# Patient Record
Sex: Female | Born: 1995 | Race: White | Hispanic: No | Marital: Single | Smoking: Current every day smoker
Health system: Southern US, Community
[De-identification: ages and names within clinical notes are randomized; demographics above are authoritative.]

## PROBLEM LIST (undated history)

## (undated) DIAGNOSIS — F3181 Bipolar II disorder: Secondary | ICD-10-CM

## (undated) DIAGNOSIS — F32A Depression, unspecified: Secondary | ICD-10-CM

## (undated) DIAGNOSIS — F603 Borderline personality disorder: Secondary | ICD-10-CM

## (undated) DIAGNOSIS — C801 Malignant (primary) neoplasm, unspecified: Secondary | ICD-10-CM

## (undated) DIAGNOSIS — Q615 Medullary cystic kidney: Secondary | ICD-10-CM

## (undated) DIAGNOSIS — F99 Mental disorder, not otherwise specified: Secondary | ICD-10-CM

## (undated) DIAGNOSIS — F419 Anxiety disorder, unspecified: Secondary | ICD-10-CM

## (undated) DIAGNOSIS — B009 Herpesviral infection, unspecified: Secondary | ICD-10-CM

## (undated) DIAGNOSIS — R569 Unspecified convulsions: Secondary | ICD-10-CM

## (undated) DIAGNOSIS — Z8489 Family history of other specified conditions: Secondary | ICD-10-CM

## (undated) DIAGNOSIS — G43909 Migraine, unspecified, not intractable, without status migrainosus: Secondary | ICD-10-CM

## (undated) DIAGNOSIS — G939 Disorder of brain, unspecified: Secondary | ICD-10-CM

## (undated) DIAGNOSIS — F633 Trichotillomania: Secondary | ICD-10-CM

## (undated) DIAGNOSIS — O2300 Infections of kidney in pregnancy, unspecified trimester: Secondary | ICD-10-CM

## (undated) DIAGNOSIS — C966 Unifocal Langerhans-cell histiocytosis: Secondary | ICD-10-CM

## (undated) HISTORY — DX: Unifocal Langerhans-cell histiocytosis: C96.6

## (undated) HISTORY — DX: Infections of kidney in pregnancy, unspecified trimester: O23.00

## (undated) HISTORY — DX: Herpesviral infection, unspecified: B00.9

## (undated) HISTORY — DX: Disorder of brain, unspecified: G93.9

---

## 2017-12-30 HISTORY — PX: WISDOM TOOTH EXTRACTION: SHX21

## 2019-02-04 DIAGNOSIS — S060XAA Concussion with loss of consciousness status unknown, initial encounter: Secondary | ICD-10-CM

## 2019-02-04 HISTORY — DX: Concussion with loss of consciousness status unknown, initial encounter: S06.0XAA

## 2020-04-16 NOTE — L&D Delivery Note (Signed)
Patient was C/C/+4 and pushed for 5 minutes with epidural.    NSVD  female infant, Apgars 8,9, weight P.   The patient had no lacerations. Fundus was firm. EBL was expected amount. Placenta was delivered intact. Vagina was clear.  Delayed cord clamping done for 30-60 seconds while warming baby. Baby was vigorous and doing skin to skin with mother.  Daria Pastures

## 2020-08-26 LAB — OB RESULTS CONSOLE GC/CHLAMYDIA
Chlamydia: NEGATIVE
Gonorrhea: NEGATIVE

## 2020-08-26 LAB — OB RESULTS CONSOLE RPR: RPR: NONREACTIVE

## 2020-08-26 LAB — OB RESULTS CONSOLE HEPATITIS B SURFACE ANTIGEN: Hepatitis B Surface Ag: NEGATIVE

## 2020-08-26 LAB — OB RESULTS CONSOLE ABO/RH: RH Type: POSITIVE

## 2020-08-26 LAB — OB RESULTS CONSOLE RUBELLA ANTIBODY, IGM: Rubella: IMMUNE

## 2020-08-26 LAB — OB RESULTS CONSOLE ANTIBODY SCREEN: Antibody Screen: NEGATIVE

## 2020-08-26 LAB — OB RESULTS CONSOLE HIV ANTIBODY (ROUTINE TESTING): HIV: NONREACTIVE

## 2020-09-28 ENCOUNTER — Other Ambulatory Visit: Payer: Self-pay | Admitting: Obstetrics and Gynecology

## 2020-09-28 ENCOUNTER — Encounter (HOSPITAL_COMMUNITY): Payer: Self-pay | Admitting: Obstetrics and Gynecology

## 2020-09-28 ENCOUNTER — Other Ambulatory Visit (HOSPITAL_COMMUNITY): Payer: Self-pay | Admitting: Obstetrics and Gynecology

## 2020-09-28 ENCOUNTER — Other Ambulatory Visit: Payer: Self-pay

## 2020-09-28 ENCOUNTER — Observation Stay (HOSPITAL_COMMUNITY): Payer: Medicaid Other

## 2020-09-28 ENCOUNTER — Observation Stay (HOSPITAL_COMMUNITY)
Admission: AD | Admit: 2020-09-28 | Discharge: 2020-09-29 | Disposition: A | Discharge status: Home / Self Care | Payer: Medicaid Other | Attending: Obstetrics | Admitting: Obstetrics

## 2020-09-28 DIAGNOSIS — B9689 Other specified bacterial agents as the cause of diseases classified elsewhere: Secondary | ICD-10-CM | POA: Diagnosis not present

## 2020-09-28 DIAGNOSIS — O26892 Other specified pregnancy related conditions, second trimester: Secondary | ICD-10-CM | POA: Diagnosis present

## 2020-09-28 DIAGNOSIS — O99332 Smoking (tobacco) complicating pregnancy, second trimester: Secondary | ICD-10-CM | POA: Insufficient documentation

## 2020-09-28 DIAGNOSIS — O26899 Other specified pregnancy related conditions, unspecified trimester: Secondary | ICD-10-CM

## 2020-09-28 DIAGNOSIS — O2302 Infections of kidney in pregnancy, second trimester: Secondary | ICD-10-CM | POA: Diagnosis not present

## 2020-09-28 DIAGNOSIS — Z3A16 16 weeks gestation of pregnancy: Secondary | ICD-10-CM | POA: Insufficient documentation

## 2020-09-28 DIAGNOSIS — Z20822 Contact with and (suspected) exposure to covid-19: Secondary | ICD-10-CM | POA: Diagnosis not present

## 2020-09-28 DIAGNOSIS — N12 Tubulo-interstitial nephritis, not specified as acute or chronic: Secondary | ICD-10-CM

## 2020-09-28 DIAGNOSIS — O2300 Infections of kidney in pregnancy, unspecified trimester: Secondary | ICD-10-CM | POA: Diagnosis present

## 2020-09-28 DIAGNOSIS — F17210 Nicotine dependence, cigarettes, uncomplicated: Secondary | ICD-10-CM | POA: Diagnosis not present

## 2020-09-28 HISTORY — DX: Mental disorder, not otherwise specified: F99

## 2020-09-28 LAB — RESP PANEL BY RT-PCR (FLU A&B, COVID) ARPGX2
Influenza A by PCR: NEGATIVE
Influenza B by PCR: NEGATIVE
SARS Coronavirus 2 by RT PCR: NEGATIVE

## 2020-09-28 MED ORDER — CALCIUM CARBONATE ANTACID 500 MG PO CHEW: 2.0000 | CHEWABLE_TABLET | ORAL | Status: DC | PRN

## 2020-09-28 MED ORDER — SODIUM CHLORIDE 0.9 % IV SOLN
1.0000 g | INTRAVENOUS | Status: DC
  Administered 2020-09-29: 1 g via INTRAVENOUS
  Filled 2020-09-28: qty 10

## 2020-09-28 MED ORDER — ACETAMINOPHEN 325 MG PO TABS: 650.0000 mg | ORAL_TABLET | ORAL | Status: DC | PRN

## 2020-09-28 MED ORDER — FAMOTIDINE 20 MG PO TABS
20.0000 mg | ORAL_TABLET | Freq: Two times a day (BID) | ORAL | Status: DC
  Filled 2020-09-28 (×2): qty 1

## 2020-09-28 MED ORDER — DOCUSATE SODIUM 100 MG PO CAPS
100.0000 mg | ORAL_CAPSULE | Freq: Every day | ORAL | Status: DC
  Filled 2020-09-28: qty 1

## 2020-09-28 MED ORDER — COMPLETENATE 29-1 MG PO CHEW: 1.0000 | CHEWABLE_TABLET | Freq: Every day | ORAL | Status: DC

## 2020-09-28 MED ORDER — LACTATED RINGERS IV SOLN: INTRAVENOUS | Status: DC

## 2020-09-28 MED ORDER — PRENATAL MULTIVITAMIN CH: 1.0000 | ORAL_TABLET | Freq: Every day | ORAL | Status: DC
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
## 2020-09-28 NOTE — Plan of Care (Signed)
  Problem: Education: Goal: Knowledge of General Education information will improve Description: Including pain rating scale, medication(s)/side effects and non-pharmacologic comfort measures Outcome: Completed/Met

## 2020-09-29 ENCOUNTER — Encounter (HOSPITAL_COMMUNITY): Payer: Self-pay

## 2020-09-29 ENCOUNTER — Ambulatory Visit (HOSPITAL_COMMUNITY): Payer: Self-pay

## 2020-09-29 DIAGNOSIS — O2302 Infections of kidney in pregnancy, second trimester: Secondary | ICD-10-CM | POA: Diagnosis not present

## 2020-09-29 MED ORDER — SULFAMETHOXAZOLE-TRIMETHOPRIM 800-160 MG PO TABS: 1.0000 | ORAL_TABLET | Freq: Two times a day (BID) | ORAL | 0 refills | Status: AC

## 2020-09-29 MED ORDER — SODIUM CHLORIDE 0.9 % IV SOLN
INTRAVENOUS | Status: DC | PRN
  Administered 2020-09-29: 250 mL via INTRAVENOUS

## 2020-09-29 MED ORDER — CEPHALEXIN 500 MG PO CAPS: 500.0000 mg | ORAL_CAPSULE | Freq: Every day | ORAL | 1 refills | Status: DC

## 2020-09-29 NOTE — Progress Notes (Addendum)
Carol Middleton 25 y.o. [id number] at [redacted]w[redacted]d HD#2 admitted with suspected pyelonephritis S: Reports feeling much better, has baseline nausea in pregnancy, no emesis. Reports dysuria and back pain improved. Some back discomfort from sleeping in hospital bed. No other concerns.   O: Vitals:   09/28/20 2000 09/28/20 2322 09/29/20 0500 09/29/20 0808  BP: 106/66 (!) 94/44 (!) 105/56 (!) 106/52  Pulse: 93 88 90 81  Resp: 18 18 18 18   Temp: 98 F (36.7 C) 99.3 F (37.4 C) 99.6 F (37.6 C) 98.9 F (37.2 C)  TempSrc: Oral Oral Oral Oral  SpO2: 100% 100% 97% 99%  Weight:      Height:       Urine culture pending  FHTs reassuring  09/28/2020 Renal US IMPRESSION: 1. No hydronephrosis or shadowing stone. 2. Artifact versus possible debris within the bladder. Correlation with urinalysis recommended.  A/P: [facility] 25 y.o. [id number] at [redacted]w[redacted]d with suspected pyelonephritis, now improving.  Pyelonephritis: s/p IV rocephin yesterday, ordered for today. Symptoms improved and remains afebrile will discharge on PO regimen while waiting urine culture. Previously prescribed cefadroxil 1g BID x 14days, but pharmacy currently does not have. Will send bactrim DS BIDx12 days (which she can crush) and then keflex 500mg  capsules QD for suppression (ok to open capsule).  Carol Middleton 09/29/20 8:28 AM

## 2020-09-29 NOTE — Plan of Care (Signed)
  Problem: Health Behavior/Discharge Planning: Goal: Ability to manage health-related needs will improve Outcome: Adequate for Discharge   Problem: Clinical Measurements: Goal: Ability to maintain clinical measurements within normal limits will improve Outcome: Adequate for Discharge Goal: Will remain free from infection Outcome: Adequate for Discharge Goal: Diagnostic test results will improve Outcome: Adequate for Discharge Goal: Respiratory complications will improve Outcome: Adequate for Discharge Goal: Cardiovascular complication will be avoided Outcome: Adequate for Discharge   Problem: Activity: Goal: Risk for activity intolerance will decrease Outcome: Adequate for Discharge   Problem: Nutrition: Goal: Adequate nutrition will be maintained Outcome: Adequate for Discharge   Problem: Coping: Goal: Level of anxiety will decrease Outcome: Adequate for Discharge   Problem: Elimination: Goal: Will not experience complications related to bowel motility Outcome: Adequate for Discharge Goal: Will not experience complications related to urinary retention Outcome: Adequate for Discharge   Problem: Pain Managment: Goal: General experience of comfort will improve Outcome: Adequate for Discharge   Problem: Safety: Goal: Ability to remain free from injury will improve Outcome: Adequate for Discharge   Problem: Skin Integrity: Goal: Risk for impaired skin integrity will decrease Outcome: Adequate for Discharge   Problem: Education: Goal: Knowledge of disease or condition will improve Outcome: Adequate for Discharge Goal: Knowledge of the prescribed therapeutic regimen will improve Outcome: Adequate for Discharge Goal: Individualized Educational Video(s) Outcome: Adequate for Discharge   Problem: Clinical Measurements: Goal: Complications related to the disease process, condition or treatment will be avoided or minimized Outcome: Adequate for Discharge

## 2020-09-29 NOTE — Discharge Summary (Signed)
     Antepartum Discharge Summary     Patient Name: Carol Middleton DOB: 05/24/1995 MRN: [id number]  Date of admission: 09/28/2020 Date of discharge: 09/29/2020  Admitting diagnosis: Pyelonephritis affecting pregnancy, antepartum [O23.00] Intrauterine pregnancy: [redacted]w[redacted]d     Secondary diagnosis:  Active Problems:   Pyelonephritis affecting pregnancy, antepartum  Additional problems: none    Discharge diagnosis: Pyelonephritis affecting pregnancy, antepartum [O23.00]                                              Hospital course: [facility] Opfer was admitted 09/28/2020 from the office for admission for suspected pyelonephritis.  She presented today with complaint of left flank pain x 1 week.  Reports painful urination, hematuria, dysuria and urinary frequency.  UA was significant for +nitrites, leukocytes and moderate heme.  Tmax in the office was 99.8.  Patient initially declined hospital admission due to concerns about childcare. In the office she received 1 dose of IM rocephin.  Stat CBC was significant for wbc 13.6.  CMP with normal creatinine. Patient then agreed with inpatient admission. She was monitored overnight, reported symptoms improved and was given a second dose of rocephin. Renal US was normal. Patient does not swallow pills, she was prescribed: bactrim DS BIDx12 days (which she can crush) and then keflex 500mg  capsules QD for suppression (ok to open capsule). On HD#2 she was ambulating, tolerating PO, voiding spontaneously. She was discharged in stable condition on 09/29/20   Physical exam  Vitals:   09/28/20 2322 09/29/20 0500 09/29/20 0808 09/29/20 1130  BP: (!) 94/44 (!) 105/56 (!) 106/52 (!) 108/52  Pulse: 88 90 81 70  Resp: 18 18 18 18   Temp: 99.3 F (37.4 C) 99.6 F (37.6 C) 98.9 F (37.2 C) 98.8 F (37.1 C)  TempSrc: Oral Oral Oral   SpO2: 100% 97% 99% 98%  Weight:      Height:       General: alert No CVA tenderness Resting comfortably in bed FHTs  reassuring   After visit meds:  Allergies as of 09/29/2020       Reactions   Codeine Hives        Medication List     TAKE these medications    cephALEXin 500 MG capsule Commonly known as: Keflex Take 1 capsule (500 mg total) by mouth daily.   sulfamethoxazole-trimethoprim 800-160 MG tablet Commonly known as: BACTRIM DS Take 1 tablet by mouth 2 (two) times daily for 12 days.         Discharge home in stable condition Follow up Visit:  Follow-up Information     Jerelyn Charles, MD Follow up.   Specialty: Obstetrics Why: Keep scheduled appointment 10/20/2020 Contact information: [address] [place] [address] [phone]                     09/29/2020 Jonelle Sidle, MD

## 2020-09-30 DIAGNOSIS — R55 Syncope and collapse: Secondary | ICD-10-CM

## 2020-09-30 HISTORY — DX: Syncope and collapse: R55

## 2020-09-30 LAB — URINE CULTURE: Culture: NO GROWTH

## 2020-10-26 ENCOUNTER — Other Ambulatory Visit: Payer: Self-pay | Admitting: Obstetrics

## 2020-10-26 DIAGNOSIS — Z3A20 20 weeks gestation of pregnancy: Secondary | ICD-10-CM

## 2020-10-26 DIAGNOSIS — Z363 Encounter for antenatal screening for malformations: Secondary | ICD-10-CM

## 2020-11-01 ENCOUNTER — Other Ambulatory Visit: Payer: Self-pay

## 2020-11-10 ENCOUNTER — Ambulatory Visit (HOSPITAL_BASED_OUTPATIENT_CLINIC_OR_DEPARTMENT_OTHER): Payer: Medicaid Other | Admitting: Obstetrics

## 2020-11-10 ENCOUNTER — Other Ambulatory Visit: Payer: Self-pay | Admitting: *Deleted

## 2020-11-10 ENCOUNTER — Ambulatory Visit: Payer: Medicaid Other | Attending: Obstetrics and Gynecology

## 2020-11-10 ENCOUNTER — Ambulatory Visit: Payer: Medicaid Other | Admitting: *Deleted

## 2020-11-10 ENCOUNTER — Other Ambulatory Visit: Payer: Self-pay

## 2020-11-10 ENCOUNTER — Encounter: Payer: Self-pay | Admitting: *Deleted

## 2020-11-10 VITALS — BP 101/56 | HR 100

## 2020-11-10 DIAGNOSIS — Z3A20 20 weeks gestation of pregnancy: Secondary | ICD-10-CM | POA: Diagnosis not present

## 2020-11-10 DIAGNOSIS — Z3A22 22 weeks gestation of pregnancy: Secondary | ICD-10-CM | POA: Insufficient documentation

## 2020-11-10 DIAGNOSIS — O283 Abnormal ultrasonic finding on antenatal screening of mother: Secondary | ICD-10-CM | POA: Insufficient documentation

## 2020-11-10 DIAGNOSIS — Z363 Encounter for antenatal screening for malformations: Secondary | ICD-10-CM | POA: Insufficient documentation

## 2020-11-10 DIAGNOSIS — O365921 Maternal care for other known or suspected poor fetal growth, second trimester, fetus 1: Secondary | ICD-10-CM | POA: Diagnosis not present

## 2020-11-10 DIAGNOSIS — O365990 Maternal care for other known or suspected poor fetal growth, unspecified trimester, not applicable or unspecified: Secondary | ICD-10-CM

## 2020-11-10 DIAGNOSIS — O359XX0 Maternal care for (suspected) fetal abnormality and damage, unspecified, not applicable or unspecified: Secondary | ICD-10-CM

## 2020-11-10 NOTE — Progress Notes (Addendum)
MFM Note  Carol Middleton was seen for a detailed fetal anatomy scan as echogenic material was noted in the fetal stomach during an exam performed in your office. The patient reports that she has a cystic lesion (which has been stable) in her brain and is currently followed by neurology at [facility].    This is her fourth child.  Her first two children were delivered at term weighing between 7 to 8 pounds.  Her last child was growth restricted weighing 5 pounds 4 ounces at term.  She denies any other significant past medical history and denies any problems in her current pregnancy.  She denies any vaginal bleeding.  She had a cell free DNA test earlier in her pregnancy which indicated a low risk for trisomy 71, 15, and 13. A female fetus is predicted.   The estimated fetal weight obtained today measures at the 6 percentile, indicating fetal growth restriction.  There was normal amniotic fluid noted today.  Doppler studies of the umbilical arteries performed today showed a normal S/D ratio of 2.79.  There were no signs of absent or reversed end-diastolic flow noted.  There were no obvious fetal anomalies noted on today's ultrasound exam.  The patient was informed that anomalies may be missed due to technical limitations. If the fetus is in a suboptimal position or maternal habitus is increased, visualization of the fetus in the maternal uterus may be impaired.  Echogenic material was not noted within the fetal stomach today.  However, a large amount of debris (possible vernix?) was noted floating/swirling around in the amniotic fluid.  The patient was advised that the previously noted echogenic material in the fetal stomach could have been the debris that was noted in the amniotic fluid today.  The fetal bowel did not appear echogenic.  The implications and management of fetal growth restriction was discussed with the patient today.  She was advised that we will continue to follow her with umbilical  artery Doppler studies and growth ultrasounds throughout her pregnancy.  Should IUGR continue to be noted during her future exams, an earlier delivery may be recommended.  The patient declined to have testing for CMV and toxoplasmosis today as echogenic material was not noted within the fetal abdomen today.    Another umbilical artery Doppler study was scheduled in our office in 2 weeks.  We will reassess the fetal growth again in 3 weeks.    The patient stated that all of her questions have been answered to her complete satisfaction.    A total of 30 minutes was spent counseling and coordinating the care for this patient.  Greater than 50% of the time was spent in direct face-to-face contact.

## 2020-11-23 ENCOUNTER — Ambulatory Visit: Payer: Medicaid Other

## 2020-11-25 ENCOUNTER — Ambulatory Visit: Payer: Medicaid Other | Attending: Obstetrics

## 2020-11-25 ENCOUNTER — Other Ambulatory Visit: Payer: Medicaid Other

## 2020-12-01 ENCOUNTER — Other Ambulatory Visit: Payer: Self-pay

## 2020-12-01 ENCOUNTER — Encounter: Payer: Self-pay | Admitting: *Deleted

## 2020-12-01 ENCOUNTER — Ambulatory Visit: Payer: Medicaid Other | Admitting: *Deleted

## 2020-12-01 ENCOUNTER — Ambulatory Visit: Payer: Medicaid Other | Attending: Obstetrics and Gynecology

## 2020-12-01 VITALS — BP 107/53 | HR 69

## 2020-12-01 DIAGNOSIS — O365990 Maternal care for other known or suspected poor fetal growth, unspecified trimester, not applicable or unspecified: Secondary | ICD-10-CM | POA: Insufficient documentation

## 2020-12-01 DIAGNOSIS — Z3A25 25 weeks gestation of pregnancy: Secondary | ICD-10-CM

## 2020-12-01 DIAGNOSIS — F17210 Nicotine dependence, cigarettes, uncomplicated: Secondary | ICD-10-CM

## 2020-12-01 DIAGNOSIS — O365920 Maternal care for other known or suspected poor fetal growth, second trimester, not applicable or unspecified: Secondary | ICD-10-CM

## 2020-12-01 DIAGNOSIS — O99332 Smoking (tobacco) complicating pregnancy, second trimester: Secondary | ICD-10-CM

## 2020-12-01 DIAGNOSIS — Z362 Encounter for other antenatal screening follow-up: Secondary | ICD-10-CM

## 2020-12-01 DIAGNOSIS — O99342 Other mental disorders complicating pregnancy, second trimester: Secondary | ICD-10-CM

## 2020-12-01 DIAGNOSIS — F319 Bipolar disorder, unspecified: Secondary | ICD-10-CM

## 2020-12-01 DIAGNOSIS — Z86011 Personal history of benign neoplasm of the brain: Secondary | ICD-10-CM

## 2020-12-02 ENCOUNTER — Other Ambulatory Visit: Payer: Self-pay | Admitting: *Deleted

## 2020-12-02 DIAGNOSIS — O365920 Maternal care for other known or suspected poor fetal growth, second trimester, not applicable or unspecified: Secondary | ICD-10-CM

## 2020-12-16 ENCOUNTER — Other Ambulatory Visit: Payer: Self-pay | Admitting: *Deleted

## 2020-12-16 ENCOUNTER — Ambulatory Visit: Payer: Medicaid Other | Admitting: *Deleted

## 2020-12-16 ENCOUNTER — Other Ambulatory Visit: Payer: Self-pay

## 2020-12-16 ENCOUNTER — Ambulatory Visit: Payer: Medicaid Other | Attending: Maternal & Fetal Medicine

## 2020-12-16 VITALS — BP 108/63 | HR 109

## 2020-12-16 DIAGNOSIS — O365920 Maternal care for other known or suspected poor fetal growth, second trimester, not applicable or unspecified: Secondary | ICD-10-CM | POA: Insufficient documentation

## 2020-12-16 DIAGNOSIS — O365930 Maternal care for other known or suspected poor fetal growth, third trimester, not applicable or unspecified: Secondary | ICD-10-CM | POA: Diagnosis not present

## 2020-12-16 DIAGNOSIS — Z3A28 28 weeks gestation of pregnancy: Secondary | ICD-10-CM

## 2020-12-16 DIAGNOSIS — Z362 Encounter for other antenatal screening follow-up: Secondary | ICD-10-CM

## 2020-12-16 DIAGNOSIS — O99333 Smoking (tobacco) complicating pregnancy, third trimester: Secondary | ICD-10-CM | POA: Diagnosis not present

## 2020-12-23 ENCOUNTER — Other Ambulatory Visit: Payer: Self-pay

## 2020-12-23 ENCOUNTER — Ambulatory Visit: Payer: Medicaid Other | Attending: Maternal & Fetal Medicine

## 2020-12-23 ENCOUNTER — Encounter: Payer: Self-pay | Admitting: *Deleted

## 2020-12-23 ENCOUNTER — Ambulatory Visit: Payer: Medicaid Other | Admitting: *Deleted

## 2020-12-23 VITALS — BP 109/60 | HR 66

## 2020-12-23 DIAGNOSIS — O365920 Maternal care for other known or suspected poor fetal growth, second trimester, not applicable or unspecified: Secondary | ICD-10-CM | POA: Insufficient documentation

## 2020-12-23 DIAGNOSIS — O365930 Maternal care for other known or suspected poor fetal growth, third trimester, not applicable or unspecified: Secondary | ICD-10-CM | POA: Insufficient documentation

## 2020-12-28 ENCOUNTER — Other Ambulatory Visit: Payer: Self-pay | Admitting: Obstetrics and Gynecology

## 2020-12-30 ENCOUNTER — Encounter: Payer: Self-pay | Admitting: *Deleted

## 2020-12-30 ENCOUNTER — Other Ambulatory Visit: Payer: Self-pay | Admitting: Obstetrics and Gynecology

## 2020-12-30 ENCOUNTER — Other Ambulatory Visit: Payer: Self-pay

## 2020-12-30 ENCOUNTER — Ambulatory Visit: Payer: Medicaid Other | Admitting: *Deleted

## 2020-12-30 ENCOUNTER — Ambulatory Visit: Payer: Medicaid Other | Attending: Obstetrics and Gynecology

## 2020-12-30 VITALS — BP 115/66 | HR 94

## 2020-12-30 DIAGNOSIS — Z3A30 30 weeks gestation of pregnancy: Secondary | ICD-10-CM

## 2020-12-30 DIAGNOSIS — O365930 Maternal care for other known or suspected poor fetal growth, third trimester, not applicable or unspecified: Secondary | ICD-10-CM | POA: Insufficient documentation

## 2020-12-30 DIAGNOSIS — O99333 Smoking (tobacco) complicating pregnancy, third trimester: Secondary | ICD-10-CM | POA: Diagnosis not present

## 2021-01-06 ENCOUNTER — Encounter: Payer: Self-pay | Admitting: *Deleted

## 2021-01-06 ENCOUNTER — Ambulatory Visit: Payer: Medicaid Other | Admitting: *Deleted

## 2021-01-06 ENCOUNTER — Ambulatory Visit: Payer: Medicaid Other | Attending: Obstetrics and Gynecology

## 2021-01-06 ENCOUNTER — Other Ambulatory Visit: Payer: Self-pay

## 2021-01-06 ENCOUNTER — Ambulatory Visit (HOSPITAL_BASED_OUTPATIENT_CLINIC_OR_DEPARTMENT_OTHER): Payer: Medicaid Other | Admitting: *Deleted

## 2021-01-06 VITALS — BP 120/68 | HR 105

## 2021-01-06 DIAGNOSIS — O365930 Maternal care for other known or suspected poor fetal growth, third trimester, not applicable or unspecified: Secondary | ICD-10-CM

## 2021-01-06 DIAGNOSIS — O99343 Other mental disorders complicating pregnancy, third trimester: Secondary | ICD-10-CM

## 2021-01-06 DIAGNOSIS — Z3A31 31 weeks gestation of pregnancy: Secondary | ICD-10-CM | POA: Insufficient documentation

## 2021-01-06 DIAGNOSIS — O99333 Smoking (tobacco) complicating pregnancy, third trimester: Secondary | ICD-10-CM

## 2021-01-06 DIAGNOSIS — F319 Bipolar disorder, unspecified: Secondary | ICD-10-CM | POA: Diagnosis not present

## 2021-01-06 IMAGING — US US MFM UA CORD DOPPLER
1 series · 14 of 28 positions shown · non-contrast
Comparison: none

[Series 1: us mfm ua cord doppler · 45 acquisitions, 14 frames shown]
[im 2/45]
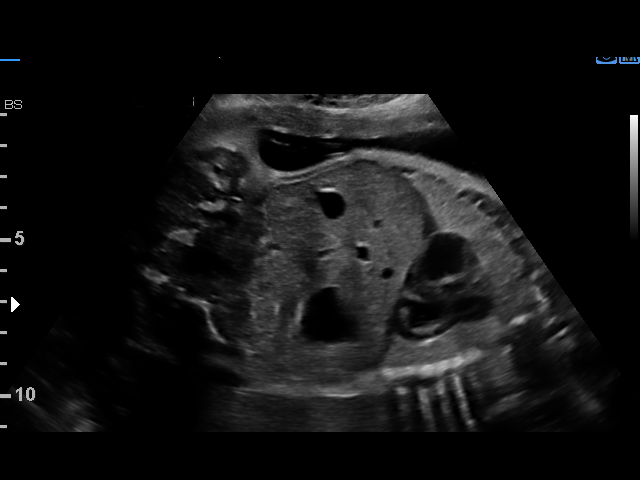
[im 5/45]
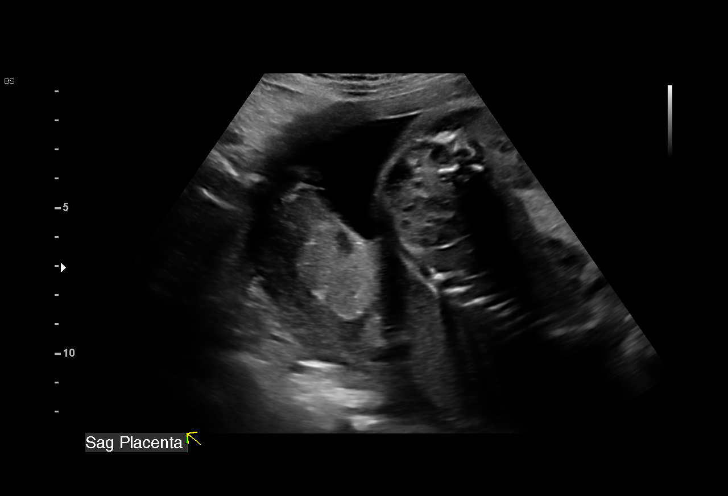
[im 9/45]
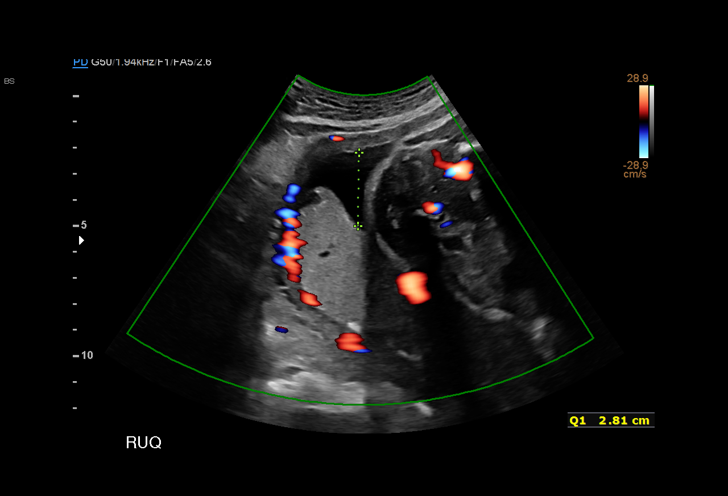
[im 12/45]
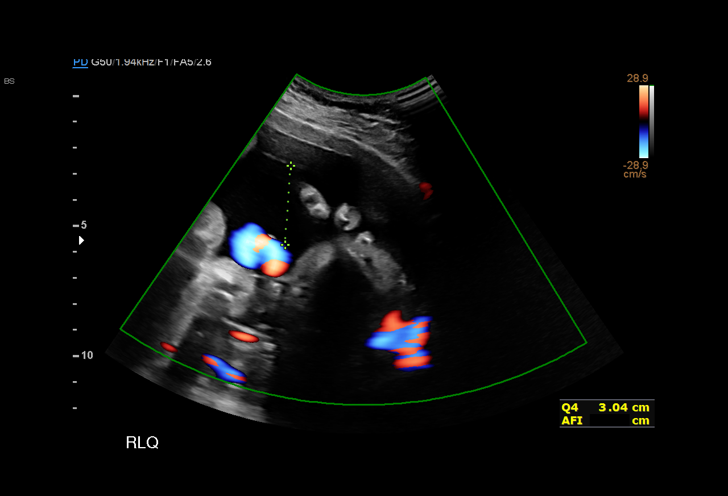
[im 15/45]
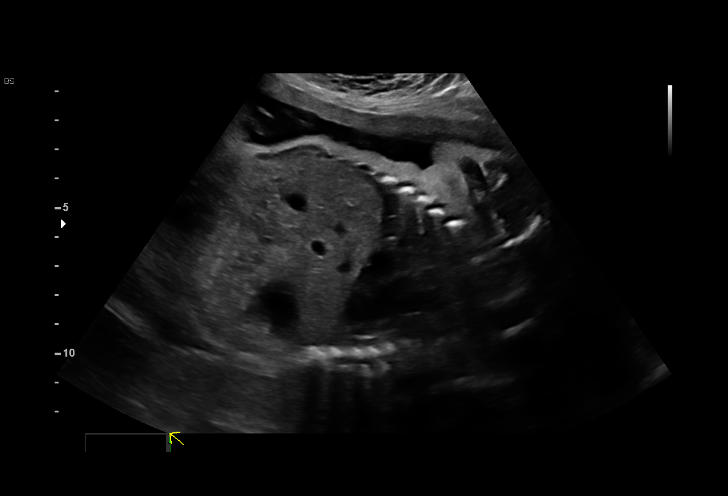
[im 18/45]
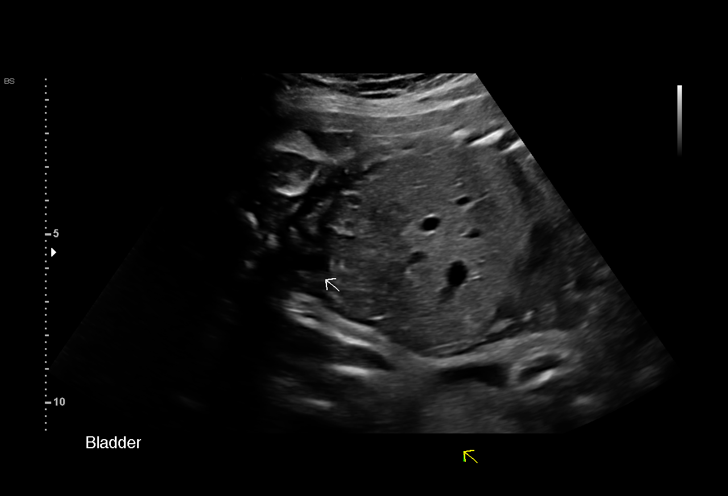
[im 22/45]
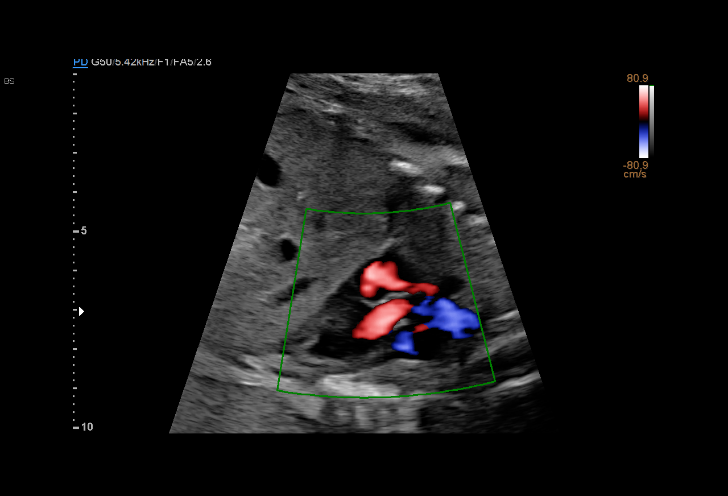
[im 25/45]
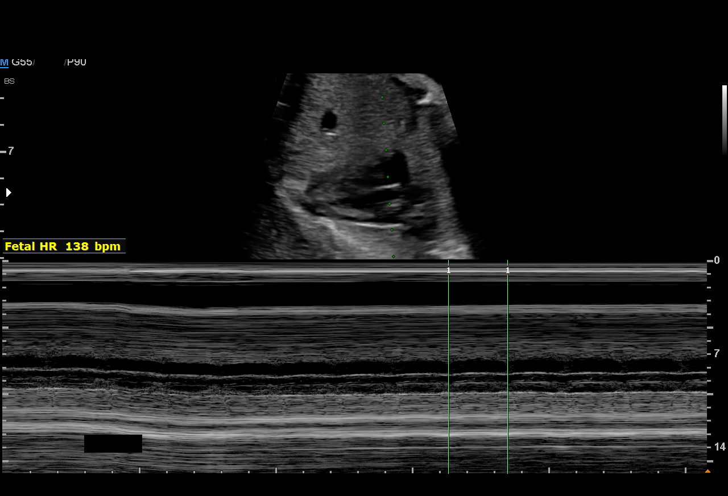
[im 28/45]
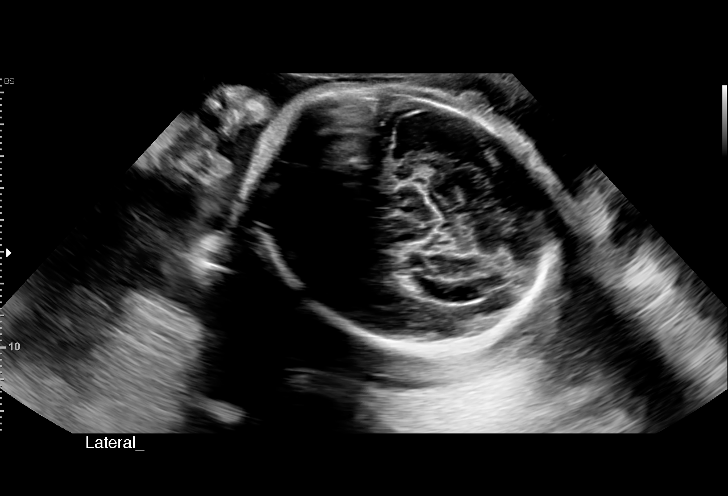
[im 31/45]
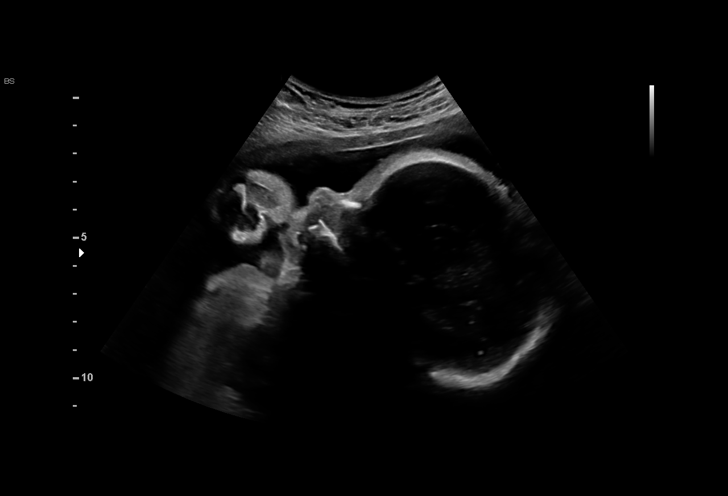
[im 35/45]
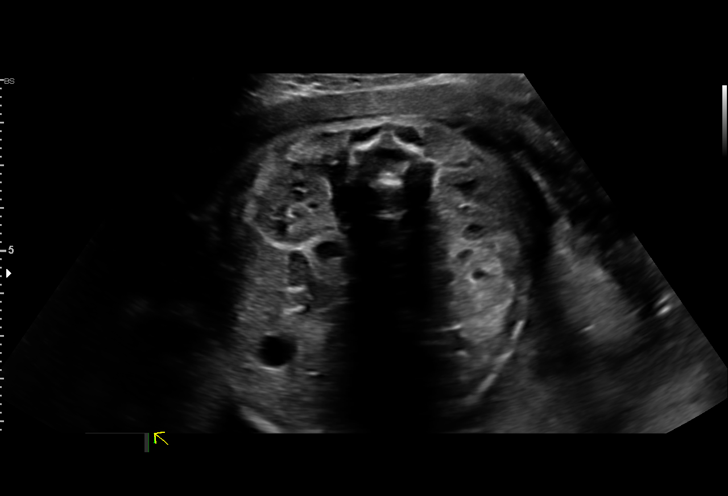
[im 38/45]
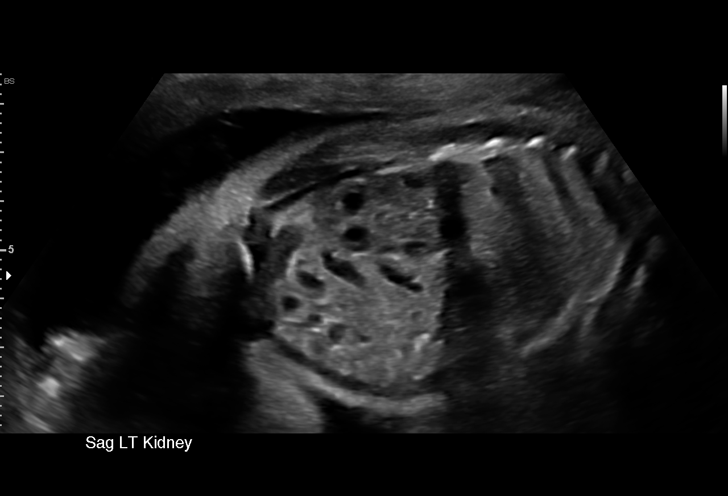
[im 41/45]
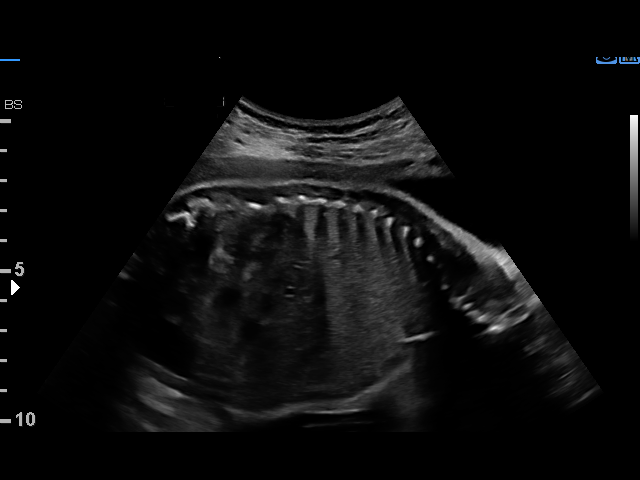
[im 45/45]
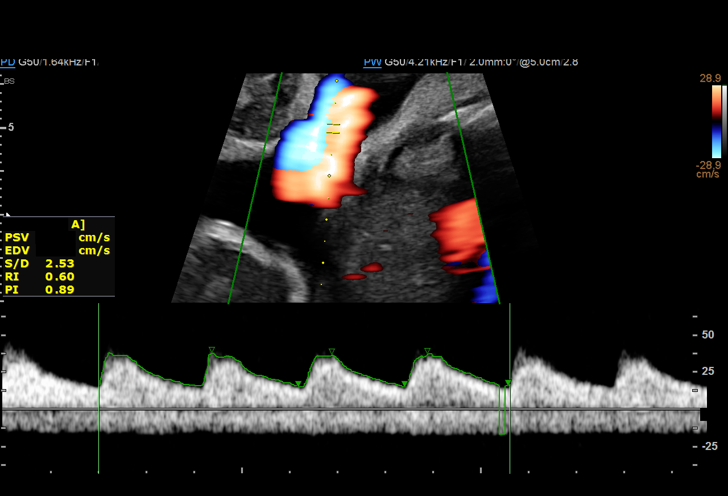

[14 of 28 positions shown; findings below may reference images not displayed]

OBGYN

Indications

 Maternal care for known or suspected poor      [E8]
 fetal growth, third trimester, not applicable or
 unspecified IUGR
 Small for gestational age fetus affecting      [E8]
 management of mother
 Medical complication of pregnancy (hx cystic   [E8]
 lesion on brain)
 Tobacco use complicating pregnancy, third      [E8]
 trimester
 Bipolar disease during pregnancy               [E8] [E8]
 LR NIPS
 Encounter for other antenatal screening        [E8]
 follow-up
 31 weeks gestation of pregnancy
Fetal Evaluation

 Num Of Fetuses:         1
 Fetal Heart Rate(bpm):  138
 Cardiac Activity:       Observed
 Presentation:           Cephalic
 Placenta:               Posterior
 P. Cord Insertion:      Visualized, central
 Amniotic Fluid
 AFI FV:      Within normal limits

 AFI Sum(cm)     %Tile       Largest Pocket(cm)
 11.51           27

 RUQ(cm)       RLQ(cm)       LUQ(cm)        LLQ(cm)

Biometry

 LV:        5.6  mm
OB History

 Gravidity:    4         Term:   3
 Living:       3
Gestational Age

 LMP:           31w 0d        Date:  [DATE]                 EDD:   [DATE]
 Best:          31w 0d     Det. By:  LMP  ([DATE])          EDD:   [DATE]
Anatomy

 Cranium:               Previously seen        Aortic Arch:            Previously seen
 Cavum:                 Previously seen        Ductal Arch:            Previously seen
 Ventricles:            Appears normal         Diaphragm:              Appears normal
 Choroid Plexus:        Previously seen        Stomach:                Appears normal, left
                                                                       sided
 Cerebellum:            Previously seen        Abdomen:                Appears normal
 Posterior Fossa:       Previously seen        Abdominal Wall:         Previously seen
 Nuchal Fold:           Not applicable (>20    Cord Vessels:           Previously seen
                        wks GA)
 Face:                  Orbits and profile     Kidneys:                Appear normal
                        previously seen
 Lips:                  Previously seen        Bladder:                Appears normal
 Thoracic:              Previously seen        Spine:                  Previously seen
 Heart:                 Previously seen        Upper Extremities:      Previously seen
 RVOT:                  Appears normal         Lower Extremities:      Previously seen
 LVOT:                  Appears normal

 Other:  Heels and 5th digit prev visualized. VC, 3VV and 3VTV prev
         visualized. Fetus appears to be a male.
Doppler - Fetal Vessels

 Umbilical Artery
  S/D     %tile      RI    %tile      PI    %tile            ADFV    RDFV
  2.47       32     0.6       35    0.88       39               No      No

Cervix Uterus Adnexa

 Cervix
 Not visualized (advanced GA >[E8])
 Uterus
 No abnormality visualized.

 Right Ovary
 Within normal limits.

 Left Ovary
 Not visualized.

 Cul De Sac
 No free fluid seen.

 Adnexa
 No adnexal mass visualized.
Comments

 This patient was seen due to an IUGR fetus.  She denies any
 problems since her last exam.  She reports feeling vigorous
 fetal movements throughout the day.
 The patient had a reactive NST today.
 There was normal amniotic fluid noted on today's ultrasound
 exam.
 Doppler studies of the umbilical arteries performed due to
 fetal growth restriction showed a normal S/D ratio of 2.47.
 There were no signs of absent or reversed end-diastolic flow
 noted today.
 She will return in 1 week for a biophysical profile and
 umbilical artery Doppler study.

## 2021-01-06 NOTE — Procedures (Signed)
Carol Middleton [birth year] [redacted]w[redacted]d  Fetus A Non-Stress Test Interpretation for 01/06/21  Indication: IUGR  Fetal Heart Rate A Mode: External Baseline Rate (A): 130 bpm Variability: Moderate Accelerations: 15 x 15 Decelerations: None Multiple birth?: No  Uterine Activity Mode: Palpation, Toco Contraction Frequency (min): UI Contraction Quality: Mild Resting Tone Palpated: Relaxed Resting Time: Adequate  Interpretation (Fetal Testing) Nonstress Test Interpretation: Reactive Comments: Dr. Annamaria Boots reviewed tracing.

## 2021-01-12 ENCOUNTER — Ambulatory Visit: Payer: Medicaid Other | Attending: Obstetrics and Gynecology

## 2021-01-12 ENCOUNTER — Ambulatory Visit: Payer: Medicaid Other

## 2021-01-17 ENCOUNTER — Ambulatory Visit: Payer: Medicaid Other | Admitting: *Deleted

## 2021-01-17 ENCOUNTER — Ambulatory Visit (HOSPITAL_BASED_OUTPATIENT_CLINIC_OR_DEPARTMENT_OTHER): Payer: Medicaid Other | Admitting: *Deleted

## 2021-01-17 ENCOUNTER — Ambulatory Visit: Payer: Medicaid Other | Attending: Obstetrics and Gynecology

## 2021-01-17 ENCOUNTER — Other Ambulatory Visit: Payer: Self-pay | Admitting: Obstetrics and Gynecology

## 2021-01-17 ENCOUNTER — Other Ambulatory Visit: Payer: Self-pay

## 2021-01-17 VITALS — BP 112/68 | HR 100

## 2021-01-17 DIAGNOSIS — O365930 Maternal care for other known or suspected poor fetal growth, third trimester, not applicable or unspecified: Secondary | ICD-10-CM

## 2021-01-17 DIAGNOSIS — O99333 Smoking (tobacco) complicating pregnancy, third trimester: Secondary | ICD-10-CM

## 2021-01-17 DIAGNOSIS — F319 Bipolar disorder, unspecified: Secondary | ICD-10-CM

## 2021-01-17 DIAGNOSIS — O99343 Other mental disorders complicating pregnancy, third trimester: Secondary | ICD-10-CM

## 2021-01-17 DIAGNOSIS — Z3A32 32 weeks gestation of pregnancy: Secondary | ICD-10-CM

## 2021-01-17 DIAGNOSIS — Z362 Encounter for other antenatal screening follow-up: Secondary | ICD-10-CM

## 2021-01-17 NOTE — Procedures (Signed)
Carol Middleton 02/13/1996 [redacted]w[redacted]d  Fetus A Non-Stress Test Interpretation for 01/17/21  Indication: IUGR  Fetal Heart Rate A Mode: External Baseline Rate (A): 135 bpm Variability: Moderate Accelerations: 15 x 15 Decelerations: Variable Multiple birth?: No  Uterine Activity Mode: Palpation, Toco Contraction Frequency (min): none Resting Tone Palpated: Relaxed  Interpretation (Fetal Testing) Nonstress Test Interpretation: Reactive Overall Impression: Reassuring for gestational age Comments: Dr. Gertie Exon reviewed tracing.

## 2021-01-18 ENCOUNTER — Other Ambulatory Visit: Payer: Self-pay | Admitting: Maternal & Fetal Medicine

## 2021-01-18 ENCOUNTER — Other Ambulatory Visit: Payer: Self-pay | Admitting: Obstetrics and Gynecology

## 2021-01-18 DIAGNOSIS — O365920 Maternal care for other known or suspected poor fetal growth, second trimester, not applicable or unspecified: Secondary | ICD-10-CM

## 2021-01-18 DIAGNOSIS — O365930 Maternal care for other known or suspected poor fetal growth, third trimester, not applicable or unspecified: Secondary | ICD-10-CM

## 2021-01-20 ENCOUNTER — Ambulatory Visit: Payer: Medicaid Other

## 2021-01-27 ENCOUNTER — Ambulatory Visit: Payer: Medicaid Other | Attending: Obstetrics and Gynecology

## 2021-01-27 ENCOUNTER — Ambulatory Visit: Payer: Medicaid Other | Admitting: *Deleted

## 2021-01-27 ENCOUNTER — Other Ambulatory Visit: Payer: Self-pay | Admitting: Obstetrics and Gynecology

## 2021-01-27 ENCOUNTER — Other Ambulatory Visit: Payer: Self-pay | Admitting: *Deleted

## 2021-01-27 ENCOUNTER — Other Ambulatory Visit: Payer: Self-pay

## 2021-01-27 VITALS — BP 118/69 | HR 95

## 2021-01-27 DIAGNOSIS — Z3A34 34 weeks gestation of pregnancy: Secondary | ICD-10-CM

## 2021-01-27 DIAGNOSIS — O365930 Maternal care for other known or suspected poor fetal growth, third trimester, not applicable or unspecified: Secondary | ICD-10-CM

## 2021-01-27 DIAGNOSIS — O99333 Smoking (tobacco) complicating pregnancy, third trimester: Secondary | ICD-10-CM

## 2021-01-27 NOTE — Procedures (Signed)
Carol Middleton [birth year] [redacted]w[redacted]d  Fetus A Non-Stress Test Interpretation for 01/27/21  Indication: IUGR  Fetal Heart Rate A Mode: External Baseline Rate (A): 130 bpm Variability: Moderate Accelerations: 15 x 15 Decelerations: None Multiple birth?: No  Uterine Activity Mode: Palpation, Toco Contraction Frequency (min): ui Resting Tone Palpated: Relaxed  Interpretation (Fetal Testing) Nonstress Test Interpretation: Reactive Overall Impression: Reassuring for gestational age Comments: Dr. Donalee Citrin reviewed tracing

## 2021-01-31 ENCOUNTER — Other Ambulatory Visit: Payer: Self-pay | Admitting: Obstetrics and Gynecology

## 2021-02-03 ENCOUNTER — Ambulatory Visit (HOSPITAL_BASED_OUTPATIENT_CLINIC_OR_DEPARTMENT_OTHER): Payer: Medicaid Other | Admitting: *Deleted

## 2021-02-03 ENCOUNTER — Other Ambulatory Visit: Payer: Self-pay | Admitting: Obstetrics and Gynecology

## 2021-02-03 ENCOUNTER — Ambulatory Visit: Payer: Medicaid Other | Admitting: *Deleted

## 2021-02-03 ENCOUNTER — Ambulatory Visit: Payer: Medicaid Other | Attending: Maternal & Fetal Medicine

## 2021-02-03 ENCOUNTER — Other Ambulatory Visit: Payer: Self-pay

## 2021-02-03 VITALS — BP 115/74 | HR 112

## 2021-02-03 DIAGNOSIS — O99343 Other mental disorders complicating pregnancy, third trimester: Secondary | ICD-10-CM

## 2021-02-03 DIAGNOSIS — F319 Bipolar disorder, unspecified: Secondary | ICD-10-CM

## 2021-02-03 DIAGNOSIS — O365930 Maternal care for other known or suspected poor fetal growth, third trimester, not applicable or unspecified: Secondary | ICD-10-CM | POA: Diagnosis not present

## 2021-02-03 DIAGNOSIS — O99333 Smoking (tobacco) complicating pregnancy, third trimester: Secondary | ICD-10-CM

## 2021-02-03 DIAGNOSIS — Z3A35 35 weeks gestation of pregnancy: Secondary | ICD-10-CM

## 2021-02-03 NOTE — Procedures (Signed)
Carol Middleton Feb 19, 1996 [redacted]w[redacted]d  Fetus A Non-Stress Test Interpretation for 02/03/21  Indication: IUGR  Fetal Heart Rate A Mode: External Baseline Rate (A): 125 bpm Variability: Moderate Accelerations: 15 x 15 Decelerations: None Multiple birth?: No  Uterine Activity Mode: Palpation, Toco Contraction Frequency (min): none Resting Tone Palpated: Relaxed  Interpretation (Fetal Testing) Nonstress Test Interpretation: Reactive Overall Impression: Reassuring for gestational age Comments: Dr. Gertie Exon reviewed tracing

## 2021-02-03 NOTE — Addendum Note (Signed)
Addended by: Vikki Ports on: 02/03/2021 11:33 AM   Modules accepted: Level of Service

## 2021-02-07 LAB — OB RESULTS CONSOLE GBS: GBS: NEGATIVE

## 2021-02-10 ENCOUNTER — Ambulatory Visit: Payer: Medicaid Other | Admitting: *Deleted

## 2021-02-10 ENCOUNTER — Ambulatory Visit: Payer: Medicaid Other | Attending: Maternal & Fetal Medicine

## 2021-02-10 ENCOUNTER — Other Ambulatory Visit: Payer: Self-pay

## 2021-02-10 ENCOUNTER — Telehealth (HOSPITAL_COMMUNITY): Payer: Self-pay | Admitting: *Deleted

## 2021-02-10 ENCOUNTER — Other Ambulatory Visit: Payer: Self-pay | Admitting: Obstetrics and Gynecology

## 2021-02-10 VITALS — BP 132/80 | HR 85

## 2021-02-10 DIAGNOSIS — O99343 Other mental disorders complicating pregnancy, third trimester: Secondary | ICD-10-CM | POA: Diagnosis not present

## 2021-02-10 DIAGNOSIS — O365920 Maternal care for other known or suspected poor fetal growth, second trimester, not applicable or unspecified: Secondary | ICD-10-CM | POA: Insufficient documentation

## 2021-02-10 DIAGNOSIS — O365930 Maternal care for other known or suspected poor fetal growth, third trimester, not applicable or unspecified: Secondary | ICD-10-CM

## 2021-02-10 DIAGNOSIS — Z3A36 36 weeks gestation of pregnancy: Secondary | ICD-10-CM | POA: Diagnosis not present

## 2021-02-10 DIAGNOSIS — F319 Bipolar disorder, unspecified: Secondary | ICD-10-CM | POA: Diagnosis not present

## 2021-02-10 NOTE — Telephone Encounter (Signed)
Preadmission screen  

## 2021-02-10 NOTE — Procedures (Signed)
Carol Middleton 05/17/1995 [redacted]w[redacted]d  Fetus A Non-Stress Test Interpretation for 02/10/21  Indication: IUGR  Fetal Heart Rate A Mode: External Baseline Rate (A): 130 bpm Variability: Moderate Accelerations: 15 x 15 Decelerations: None Multiple birth?: No  Uterine Activity Mode: Palpation, Toco Contraction Frequency (min): none Resting Tone Palpated: Relaxed  Interpretation (Fetal Testing) Nonstress Test Interpretation: Reactive Overall Impression: Reassuring for gestational age Comments: Dr. Annamaria Boots reviewed tracing

## 2021-02-15 ENCOUNTER — Encounter (HOSPITAL_COMMUNITY): Payer: Self-pay | Admitting: Obstetrics and Gynecology

## 2021-02-17 ENCOUNTER — Inpatient Hospital Stay (HOSPITAL_COMMUNITY)
Admission: AD | Admit: 2021-02-17 | Discharge: 2021-02-19 | DRG: 807 | Disposition: A | Discharge status: Home / Self Care | Payer: Medicaid Other | Attending: Obstetrics and Gynecology | Admitting: Obstetrics and Gynecology

## 2021-02-17 ENCOUNTER — Encounter (HOSPITAL_COMMUNITY): Payer: Self-pay | Admitting: Obstetrics and Gynecology

## 2021-02-17 ENCOUNTER — Other Ambulatory Visit: Payer: Self-pay

## 2021-02-17 ENCOUNTER — Inpatient Hospital Stay (HOSPITAL_COMMUNITY): Payer: Medicaid Other | Admitting: Anesthesiology

## 2021-02-17 ENCOUNTER — Inpatient Hospital Stay (HOSPITAL_COMMUNITY): Payer: Medicaid Other

## 2021-02-17 DIAGNOSIS — O99334 Smoking (tobacco) complicating childbirth: Secondary | ICD-10-CM | POA: Diagnosis present

## 2021-02-17 DIAGNOSIS — O365990 Maternal care for other known or suspected poor fetal growth, unspecified trimester, not applicable or unspecified: Secondary | ICD-10-CM | POA: Diagnosis present

## 2021-02-17 DIAGNOSIS — O365930 Maternal care for other known or suspected poor fetal growth, third trimester, not applicable or unspecified: Secondary | ICD-10-CM | POA: Diagnosis present

## 2021-02-17 DIAGNOSIS — F17210 Nicotine dependence, cigarettes, uncomplicated: Secondary | ICD-10-CM | POA: Diagnosis present

## 2021-02-17 DIAGNOSIS — Z20822 Contact with and (suspected) exposure to covid-19: Secondary | ICD-10-CM | POA: Diagnosis present

## 2021-02-17 DIAGNOSIS — Z23 Encounter for immunization: Secondary | ICD-10-CM | POA: Diagnosis not present

## 2021-02-17 DIAGNOSIS — Z3A37 37 weeks gestation of pregnancy: Secondary | ICD-10-CM

## 2021-02-17 HISTORY — DX: Bipolar II disorder: F31.81

## 2021-02-17 HISTORY — DX: Malignant (primary) neoplasm, unspecified: C80.1

## 2021-02-17 HISTORY — DX: Anxiety disorder, unspecified: F41.9

## 2021-02-17 HISTORY — DX: Borderline personality disorder: F60.3

## 2021-02-17 HISTORY — DX: Unspecified convulsions: R56.9

## 2021-02-17 HISTORY — DX: Depression, unspecified: F32.A

## 2021-02-17 HISTORY — DX: Trichotillomania: F63.3

## 2021-02-17 LAB — CBC
HCT: 32.2 % — ABNORMAL LOW (ref 36.0–46.0)
Hemoglobin: 11.2 g/dL — ABNORMAL LOW (ref 12.0–15.0)
MCH: 30.4 pg (ref 26.0–34.0)
MCHC: 34.8 g/dL (ref 30.0–36.0)
MCV: 87.3 fL (ref 80.0–100.0)
Platelets: 297 10*3/uL (ref 150–400)
RBC: 3.69 MIL/uL — ABNORMAL LOW (ref 3.87–5.11)
RDW: 12.3 % (ref 11.5–15.5)
WBC: 13.5 10*3/uL — ABNORMAL HIGH (ref 4.0–10.5)
nRBC: 0 % (ref 0.0–0.2)

## 2021-02-17 LAB — TYPE AND SCREEN
ABO/RH(D): A POS
Antibody Screen: NEGATIVE

## 2021-02-17 LAB — RESP PANEL BY RT-PCR (FLU A&B, COVID) ARPGX2
Influenza A by PCR: NEGATIVE
Influenza B by PCR: NEGATIVE
SARS Coronavirus 2 by RT PCR: NEGATIVE

## 2021-02-17 LAB — RPR: RPR Ser Ql: NONREACTIVE

## 2021-02-17 MED ORDER — LACTATED RINGERS IV SOLN: INTRAVENOUS | Status: DC

## 2021-02-17 MED ORDER — OXYTOCIN-SODIUM CHLORIDE 30-0.9 UT/500ML-% IV SOLN: 2.5000 [IU]/h | INTRAVENOUS | Status: DC

## 2021-02-17 MED ORDER — FLEET ENEMA 7-19 GM/118ML RE ENEM: 1.0000 | ENEMA | RECTAL | Status: DC | PRN

## 2021-02-17 MED ORDER — BUTORPHANOL TARTRATE 1 MG/ML IJ SOLN: 1.0000 mg | INTRAMUSCULAR | Status: DC | PRN

## 2021-02-17 MED ORDER — ONDANSETRON HCL 4 MG/2ML IJ SOLN
4.0000 mg | Freq: Four times a day (QID) | INTRAMUSCULAR | Status: DC | PRN
  Administered 2021-02-17: 4 mg via INTRAVENOUS
  Filled 2021-02-17: qty 2

## 2021-02-17 MED ORDER — LACTATED RINGERS IV SOLN
500.0000 mL | Freq: Once | INTRAVENOUS | Status: AC
  Administered 2021-02-17: 500 mL via INTRAVENOUS

## 2021-02-17 MED ORDER — LACTATED RINGERS IV SOLN: 500.0000 mL | INTRAVENOUS | Status: DC | PRN

## 2021-02-17 MED ORDER — DIPHENHYDRAMINE HCL 50 MG/ML IJ SOLN: 12.5000 mg | INTRAMUSCULAR | Status: DC | PRN

## 2021-02-17 MED ORDER — LIDOCAINE HCL (PF) 1 % IJ SOLN
INTRAMUSCULAR | Status: DC | PRN
  Administered 2021-02-17: 5 mL via EPIDURAL

## 2021-02-17 MED ORDER — PHENYLEPHRINE 40 MCG/ML (10ML) SYRINGE FOR IV PUSH (FOR BLOOD PRESSURE SUPPORT): 80.0000 ug | PREFILLED_SYRINGE | INTRAVENOUS | Status: DC | PRN

## 2021-02-17 MED ORDER — EPHEDRINE 5 MG/ML INJ: 10.0000 mg | INTRAVENOUS | Status: DC | PRN

## 2021-02-17 MED ORDER — OXYTOCIN-SODIUM CHLORIDE 30-0.9 UT/500ML-% IV SOLN
INTRAVENOUS | Status: AC
  Filled 2021-02-17: qty 500

## 2021-02-17 MED ORDER — FENTANYL-BUPIVACAINE-NACL 0.5-0.125-0.9 MG/250ML-% EP SOLN
12.0000 mL/h | EPIDURAL | Status: DC | PRN
  Administered 2021-02-17: 12 mL/h via EPIDURAL
  Filled 2021-02-17: qty 250

## 2021-02-17 MED ORDER — OXYTOCIN-SODIUM CHLORIDE 30-0.9 UT/500ML-% IV SOLN
1.0000 m[IU]/min | INTRAVENOUS | Status: DC
  Administered 2021-02-17: 2 m[IU]/min via INTRAVENOUS

## 2021-02-17 MED ORDER — SOD CITRATE-CITRIC ACID 500-334 MG/5ML PO SOLN: 30.0000 mL | ORAL | Status: DC | PRN

## 2021-02-17 MED ORDER — MISOPROSTOL 25 MCG QUARTER TABLET
25.0000 ug | ORAL_TABLET | ORAL | Status: DC | PRN
  Administered 2021-02-17: 25 ug via VAGINAL
  Filled 2021-02-17: qty 1

## 2021-02-17 MED ORDER — OXYTOCIN BOLUS FROM INFUSION
333.0000 mL | Freq: Once | INTRAVENOUS | Status: AC
  Administered 2021-02-17: 333 mL via INTRAVENOUS

## 2021-02-17 MED ORDER — TERBUTALINE SULFATE 1 MG/ML IJ SOLN: 0.2500 mg | Freq: Once | INTRAMUSCULAR | Status: DC | PRN

## 2021-02-17 MED ORDER — LIDOCAINE HCL (PF) 1 % IJ SOLN: 30.0000 mL | INTRAMUSCULAR | Status: DC | PRN

## 2021-02-17 MED ORDER — ACETAMINOPHEN 325 MG PO TABS: 650.0000 mg | ORAL_TABLET | ORAL | Status: DC | PRN

## 2021-02-17 NOTE — Anesthesia Procedure Notes (Signed)
Epidural Patient location during procedure: OB Start time: 02/17/2021 6:52 PM End time: 02/17/2021 7:02 PM  Staffing Anesthesiologist: Murvin Natal, MD Performed: anesthesiologist   Preanesthetic Checklist Completed: patient identified, IV checked, site marked, risks and benefits discussed, monitors and equipment checked, pre-op evaluation and timeout performed  Epidural Patient position: sitting Prep: DuraPrep Patient monitoring: heart rate, cardiac monitor, continuous pulse ox and blood pressure Approach: midline Location: L4-L5 Injection technique: LOR air  Needle:  Needle type: Tuohy  Needle gauge: 17 G Needle length: 9 cm Needle insertion depth: 5 cm Catheter type: closed end flexible Catheter size: 19 Gauge Catheter at skin depth: 10 cm Test dose: negative, Other and 1.5% lidocaine with Epi 1:200 K  Assessment Events: blood not aspirated, injection not painful, no injection resistance and negative IV test  Additional Notes Informed consent obtained prior to proceeding including risk of failure, 1% risk of PDPH, risk of minor discomfort and bruising. Discussed rare but serious complications. Discussed alternatives to epidural analgesia and patient desires to proceed.  Timeout performed pre-procedure verifying patient name, procedure, and platelet count.  Patient tolerated procedure well. Reason for block:procedure for pain

## 2021-02-17 NOTE — Lactation Note (Signed)
This note was copied from a baby's chart. Lactation Consultation Note  Patient Name: Carol Middleton DTHYH'O Date: 02/17/2021 Reason for consult: L&D Initial assessment;Mother's request;1st time breastfeeding;Early term 37-38.6wks;Breastfeeding assistance;Difficult latch Age:27 hours  LC attempted to latch infant in different positions, infant sleeping at breast. No colostrum noted with hand expression.  Mom accessory nipple on left breast no colostrum leakage from it at time of visit. Accessory nipple noted below the breast in the midline. Mom noted in past leakage from it requiring use of cabbage leaves to dry up.   Mom extensive medical hx, LC encouraged Mom to ask her OBGYN to examine it if she has other concerns.   LC not able to get infant to latch. Mom's feeding plan is breast /formula and will offer formula 5 ml, and then try a latch.  Mom to receive further LC support on the floor.   Maternal Data Has patient been taught Hand Expression?: Yes Does the patient have breastfeeding experience prior to this delivery?: No  Feeding Mother's Current Feeding Choice: Breast Milk and Formula  LATCH Score                    Lactation Tools Discussed/Used    Interventions Interventions: Breast feeding basics reviewed;Support pillows;Education;Assisted with latch;Position options;Skin to skin;Expressed milk;Hand express;Infant Driven Feeding Algorithm education;Breast compression;Adjust position  Discharge    Consult Status Consult Status: Follow-up from L&D Date: 02/18/21 Follow-up type: In-patient    Vestal Markin  Nicholson-Springer 02/17/2021, 10:09 PM

## 2021-02-17 NOTE — H&P (Addendum)
25 y.o. [redacted]w[redacted]d  [place] pt comes in for induction at early term for IUGR..  Otherwise has good fetal movement and no bleeding.  Previously:"Comments: GBS-negative; Pt. getting weekly BPP/NST/dopplers at [facility] for IUGR, also had growth scan at this last visit on 10/28; Pt. denies any problems; no LOF or VB; active FM. 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 Unable to void/vk [facility] scan on 10/28: BPP 8/8, EFW 8%, normal dopplers, normal fluid. Pt reports good FM,."  AC was 7%ile; induction at 37 weeks per MFM. GBS is neg.   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[place])    Mental disorder    Borderline Personality, Bipolar 2, Depression, Anxiety, PTSD   Pyelonephritis affecting pregnancy    Seizures ([place])    Trichotillomania     Past Surgical History:  Procedure Laterality Date   WISDOM TOOTH EXTRACTION  12/30/2017    OB History  Gravida Para Term Preterm AB Living  4 3 3     3   SAB IAB Ectopic Multiple Live Births          3    # Outcome Date GA Lbr Len/2nd Weight Sex Delivery Anes PTL Lv  4 Current           3 Term 11/05/16 [redacted]w[redacted]d  2381 g M Vag-Spont   LIV  2 Term 12/03/14 [redacted]w[redacted]d  3289 g F Vag-Spont   LIV  1 Term 05/05/11 [redacted]w[redacted]d  3629 g F Vag-Spont   LIV    Social History   Socioeconomic History   Marital status: Single    Spouse name: Not on file   Number of children: Not on file   Years of education: Not on file   Highest education level: Not on file  Occupational History   Not on file  Tobacco Use   Smoking status: Every Day    Packs/day: 0.50    Years: 5.00    Pack years: 2.50    Types:  Cigarettes   Smokeless tobacco: Never  Vaping Use   Vaping Use: Never used  Substance and Sexual Activity   Alcohol use: Not Currently   Drug use: Not Currently   Sexual activity: Yes    Birth control/protection: None  Other Topics Concern   Not on file  Social History Narrative   Not on file   Social Determinants of Health   Financial Resource Strain: Not on file  Food Insecurity: Not on file  Transportation Needs: Not on file  Physical Activity: Not on file  Stress: Not on file  Social Connections: Not on file  Intimate Partner Violence: Not on file   Bee venom, Codeine, Keflex [cephalexin], and Pepcid [famotidine]    Prenatal Transfer Tool  Maternal Diabetes: No Genetic Screening: Normal Maternal Ultrasounds/Referrals: IUGR Fetal Ultrasounds or other Referrals:  Referred to Materal Fetal Medicine  Maternal Substance Abuse:  Yes:  Type: Smoker Significant Maternal Medications:  None Significant Maternal Lab Results: Group B Strep negative  Other PNC: uncomplicated.    Vitals:   02/17/21 0721  Weight: 64.2  kg    Lungs/Cor:  NAD Abdomen:  soft, gravid Ex:  no cords, erythema SVE:  in office 1/30/-3 SSE neg lesions FHTs:  120s, good STV, NST R; Cat 1 tracing. Toco:  q 0   A/P   Early IUGR with stable growth and dopplers.  Delivery planned at 37 weeks per MFM.  Pt has hx of +HSV serology but no lesions ever and was not on prophylaxis.  SSE neg for lesions.  Hx biploar but not on meds during pregnancy.  GBS neg.    Daria Pastures

## 2021-02-17 NOTE — Plan of Care (Signed)
  Problem: Education: Goal: Knowledge of General Education information will improve Description: Including pain rating scale, medication(s)/side effects and non-pharmacologic comfort measures 02/17/2021 2202 by Laruth Bouchard, RN Outcome: Progressing 02/17/2021 1927 by Laruth Bouchard, RN Outcome: Progressing   Problem: Health Behavior/Discharge Planning: Goal: Ability to manage health-related needs will improve 02/17/2021 2202 by Laruth Bouchard, RN Outcome: Progressing 02/17/2021 1927 by Laruth Bouchard, RN Outcome: Progressing   Problem: Clinical Measurements: Goal: Ability to maintain clinical measurements within normal limits will improve 02/17/2021 2202 by Laruth Bouchard, RN Outcome: Progressing 02/17/2021 1927 by Laruth Bouchard, RN Outcome: Progressing Goal: Will remain free from infection 02/17/2021 2202 by Laruth Bouchard, RN Outcome: Progressing 02/17/2021 1927 by Laruth Bouchard, RN Outcome: Progressing Goal: Diagnostic test results will improve 02/17/2021 2202 by Laruth Bouchard, RN Outcome: Progressing 02/17/2021 1927 by Eual Fines D, RN Outcome: Progressing Goal: Respiratory complications will improve 02/17/2021 2202 by Laruth Bouchard, RN Outcome: Progressing 02/17/2021 1927 by Laruth Bouchard, RN Outcome: Progressing Goal: Cardiovascular complication will be avoided 02/17/2021 2202 by Laruth Bouchard, RN Outcome: Progressing 02/17/2021 1927 by Laruth Bouchard, RN Outcome: Progressing   Problem: Activity: Goal: Risk for activity intolerance will decrease 02/17/2021 2202 by Laruth Bouchard, RN Outcome: Progressing 02/17/2021 1927 by Laruth Bouchard, RN Outcome: Progressing   Problem: Nutrition: Goal: Adequate nutrition will be maintained 02/17/2021 2202 by Laruth Bouchard, RN Outcome: Progressing 02/17/2021 1927 by Eual Fines D, RN Outcome:  Progressing   Problem: Coping: Goal: Level of anxiety will decrease 02/17/2021 2202 by Laruth Bouchard, RN Outcome: Progressing 02/17/2021 1927 by Laruth Bouchard, RN Outcome: Progressing   Problem: Elimination: Goal: Will not experience complications related to bowel motility 02/17/2021 2202 by Laruth Bouchard, RN Outcome: Progressing 02/17/2021 1927 by Laruth Bouchard, RN Outcome: Progressing Goal: Will not experience complications related to urinary retention 02/17/2021 2202 by Laruth Bouchard, RN Outcome: Progressing 02/17/2021 1927 by Laruth Bouchard, RN Outcome: Progressing   Problem: Pain Managment: Goal: General experience of comfort will improve 02/17/2021 2202 by Laruth Bouchard, RN Outcome: Progressing 02/17/2021 1927 by Laruth Bouchard, RN Outcome: Progressing   Problem: Safety: Goal: Ability to remain free from injury will improve 02/17/2021 2202 by Laruth Bouchard, RN Outcome: Progressing 02/17/2021 1927 by Laruth Bouchard, RN Outcome: Progressing   Problem: Skin Integrity: Goal: Risk for impaired skin integrity will decrease 02/17/2021 2202 by Laruth Bouchard, RN Outcome: Progressing 02/17/2021 1927 by Laruth Bouchard, RN Outcome: Progressing

## 2021-02-17 NOTE — Anesthesia Preprocedure Evaluation (Signed)
Anesthesia Evaluation  Patient identified by MRN, date of birth, ID band Patient awake    Reviewed: Allergy & Precautions, H&P , NPO status , Patient's Chart, lab work & pertinent test results  History of Anesthesia Complications Negative for: history of anesthetic complications  Airway Mallampati: II  TM Distance: >3 FB Neck ROM: full    Dental no notable dental hx. (+) Teeth Intact   Pulmonary Current Smoker,    Pulmonary exam normal breath sounds clear to auscultation       Cardiovascular negative cardio ROS Normal cardiovascular exam Rhythm:regular Rate:Normal     Neuro/Psych Seizures -,  PSYCHIATRIC DISORDERS Anxiety Depression Bipolar Disorder Mental disorder Brain lesion    GI/Hepatic negative GI ROS, Neg liver ROS,   Endo/Other  negative endocrine ROS  Renal/GU negative Renal ROS  negative genitourinary   Musculoskeletal   Abdominal   Peds  Hematology  (+) Blood dyscrasia, anemia ,   Anesthesia Other Findings   Reproductive/Obstetrics (+) Pregnancy                             Anesthesia Physical Anesthesia Plan  ASA: 3  Anesthesia Plan: Epidural   Post-op Pain Management:    Induction:   PONV Risk Score and Plan:   Airway Management Planned:   Additional Equipment:   Intra-op Plan:   Post-operative Plan:   Informed Consent: I have reviewed the patients History and Physical, chart, labs and discussed the procedure including the risks, benefits and alternatives for the proposed anesthesia with the patient or authorized representative who has indicated his/her understanding and acceptance.       Plan Discussed with:   Anesthesia Plan Comments:         Anesthesia Quick Evaluation

## 2021-02-17 NOTE — Plan of Care (Signed)

## 2021-02-18 LAB — CBC
HCT: 29.8 % — ABNORMAL LOW (ref 36.0–46.0)
Hemoglobin: 10.2 g/dL — ABNORMAL LOW (ref 12.0–15.0)
MCH: 29.5 pg (ref 26.0–34.0)
MCHC: 34.2 g/dL (ref 30.0–36.0)
MCV: 86.1 fL (ref 80.0–100.0)
Platelets: 274 10*3/uL (ref 150–400)
RBC: 3.46 MIL/uL — ABNORMAL LOW (ref 3.87–5.11)
RDW: 12.2 % (ref 11.5–15.5)
WBC: 16.2 10*3/uL — ABNORMAL HIGH (ref 4.0–10.5)
nRBC: 0 % (ref 0.0–0.2)

## 2021-02-18 MED ORDER — DIBUCAINE (PERIANAL) 1 % EX OINT: 1.0000 "application " | TOPICAL_OINTMENT | CUTANEOUS | Status: DC | PRN

## 2021-02-18 MED ORDER — OXYCODONE-ACETAMINOPHEN 5-325 MG PO TABS: 2.0000 | ORAL_TABLET | ORAL | Status: DC | PRN

## 2021-02-18 MED ORDER — ONDANSETRON HCL 4 MG PO TABS: 4.0000 mg | ORAL_TABLET | ORAL | Status: DC | PRN

## 2021-02-18 MED ORDER — PRENATAL MULTIVITAMIN CH
1.0000 | ORAL_TABLET | Freq: Every day | ORAL | Status: DC
  Administered 2021-02-18: 1 via ORAL
  Filled 2021-02-18: qty 1

## 2021-02-18 MED ORDER — ZOLPIDEM TARTRATE 5 MG PO TABS: 5.0000 mg | ORAL_TABLET | Freq: Every evening | ORAL | Status: DC | PRN

## 2021-02-18 MED ORDER — METHYLERGONOVINE MALEATE 0.2 MG/ML IJ SOLN: 0.2000 mg | INTRAMUSCULAR | Status: DC | PRN

## 2021-02-18 MED ORDER — SIMETHICONE 80 MG PO CHEW: 80.0000 mg | CHEWABLE_TABLET | ORAL | Status: DC | PRN

## 2021-02-18 MED ORDER — DIPHENHYDRAMINE HCL 25 MG PO CAPS: 25.0000 mg | ORAL_CAPSULE | Freq: Four times a day (QID) | ORAL | Status: DC | PRN

## 2021-02-18 MED ORDER — FERROUS SULFATE 325 (65 FE) MG PO TABS
325.0000 mg | ORAL_TABLET | Freq: Two times a day (BID) | ORAL | Status: DC
  Administered 2021-02-18 – 2021-02-19 (×2): 325 mg via ORAL
  Filled 2021-02-18 (×3): qty 1

## 2021-02-18 MED ORDER — INFLUENZA VAC SPLIT QUAD 0.5 ML IM SUSY
0.5000 mL | PREFILLED_SYRINGE | INTRAMUSCULAR | Status: AC | PRN
  Administered 2021-02-19: 0.5 mL via INTRAMUSCULAR
  Filled 2021-02-18: qty 0.5

## 2021-02-18 MED ORDER — MEASLES, MUMPS & RUBELLA VAC IJ SOLR: 0.5000 mL | Freq: Once | INTRAMUSCULAR | Status: DC

## 2021-02-18 MED ORDER — WITCH HAZEL-GLYCERIN EX PADS: 1.0000 "application " | MEDICATED_PAD | CUTANEOUS | Status: DC | PRN

## 2021-02-18 MED ORDER — ONDANSETRON HCL 4 MG/2ML IJ SOLN: 4.0000 mg | INTRAMUSCULAR | Status: DC | PRN

## 2021-02-18 MED ORDER — BENZOCAINE-MENTHOL 20-0.5 % EX AERO: 1.0000 "application " | INHALATION_SPRAY | CUTANEOUS | Status: DC | PRN

## 2021-02-18 MED ORDER — MAGNESIUM HYDROXIDE 400 MG/5ML PO SUSP: 30.0000 mL | ORAL | Status: DC | PRN

## 2021-02-18 MED ORDER — SODIUM CHLORIDE 0.9% FLUSH: 3.0000 mL | INTRAVENOUS | Status: DC | PRN

## 2021-02-18 MED ORDER — ACETAMINOPHEN 325 MG PO TABS
650.0000 mg | ORAL_TABLET | ORAL | Status: DC | PRN
  Filled 2021-02-18: qty 2

## 2021-02-18 MED ORDER — PNEUMOCOCCAL VAC POLYVALENT 25 MCG/0.5ML IJ INJ
0.5000 mL | INJECTION | INTRAMUSCULAR | Status: DC | PRN
  Filled 2021-02-18: qty 0.5

## 2021-02-18 MED ORDER — COCONUT OIL OIL: 1.0000 "application " | TOPICAL_OIL | Status: DC | PRN

## 2021-02-18 MED ORDER — IBUPROFEN 800 MG PO TABS
800.0000 mg | ORAL_TABLET | Freq: Three times a day (TID) | ORAL | Status: DC
  Filled 2021-02-18: qty 1

## 2021-02-18 MED ORDER — METHYLERGONOVINE MALEATE 0.2 MG PO TABS: 0.2000 mg | ORAL_TABLET | ORAL | Status: DC | PRN

## 2021-02-18 MED ORDER — SODIUM CHLORIDE 0.9 % IV SOLN: 250.0000 mL | INTRAVENOUS | Status: DC | PRN

## 2021-02-18 MED ORDER — TETANUS-DIPHTH-ACELL PERTUSSIS 5-2.5-18.5 LF-MCG/0.5 IM SUSY: 0.5000 mL | PREFILLED_SYRINGE | Freq: Once | INTRAMUSCULAR | Status: DC

## 2021-02-18 MED ORDER — SENNOSIDES-DOCUSATE SODIUM 8.6-50 MG PO TABS
2.0000 | ORAL_TABLET | Freq: Every day | ORAL | Status: DC
  Administered 2021-02-18: 2 via ORAL
  Filled 2021-02-18: qty 2

## 2021-02-18 MED ORDER — SODIUM CHLORIDE 0.9% FLUSH: 3.0000 mL | Freq: Two times a day (BID) | INTRAVENOUS | Status: DC

## 2021-02-18 NOTE — Progress Notes (Signed)
CSW met with MOB to complete consult for mental health. CSW observed MOB resting in bed, bonding with infant. CSW explained role, and reason for consult. MOB was pleasant, and polite during engagement with CSW. MOB reported, history of anxiety, depression, bipolar, borderline personality, and PTSD in 2017.  MOB reported, history of a variety psychotropic medications (Prozac,  Abilify, and Buspar). MOB reported, her last dose of psychotropic medication was approximately two years ago. MOB reported, she has been able to manage symptoms without medication. MOB reported, she may resume medication in the future. MOB reported, history of therapy at [facility], and knows how to contact them if needed. CSW encourage MOB to implement healthy coping skills when symptoms arises.   MOB reported, she experience postpartum depression with her 1st child. CSW provided education regarding the baby blues period vs. perinatal mood disorders, discussed treatment and gave resources for mental health follow up if concerns arise. CSW recommends self- evaluation during the postpartum time period using the New Mom Checklist from Postpartum Progress and encouraged MOB to contact a medical professional if symptoms are noted at any time.   MOB reported, since delivery she feels, "good". MOB reported, her father, and best friend are very supportive. MOB denied SI, HI, and DV when CSW assessed for safety.   MOB reported, there are no transportation barriers to follow up infant's care. MOB reported, she has all essentials needed to care for infant. MOB reported, infant has a car seat, and bassinet. MOB denied any additional barriers.     CSW provided education on sudden infant death syndrome (SIDS).  CSW provided perinatal mood disorder resources.   CSW identifies no further need for intervention or barriers to discharge at this time.  Carol Middleton, MSW, LCSW-A Clinical Social Worker-  Weekends [phone]  

## 2021-02-18 NOTE — Lactation Note (Signed)
This note was copied from a baby's chart. Lactation Consultation Note  Patient Name: Carol Middleton [id number] Date: 02/18/2021 Reason for consult: Initial assessment;Early term 37-38.6wks Age:27 hours   LC Note:  Attempted to visit with mother, however, she was in the shower.  Asked father to have her call me when it is convenient to return for a visit.    Maternal Data    Feeding Mother's Current Feeding Choice: Breast Milk and Formula Nipple Type: Slow - flow  LATCH Score                    Lactation Tools Discussed/Used    Interventions    Discharge    Consult Status Consult Status: Follow-up Date: 02/18/21 Follow-up type: In-patient    [name] R Eduard Penkala 02/18/2021, 9:06 AM

## 2021-02-18 NOTE — Lactation Note (Signed)
This note was copied from a baby's chart. Lactation Consultation Note  Patient Name: Carol Middleton Date: 02/18/2021 Reason for consult: Initial assessment;Early term 37-38.6wks Age:27 hours   LC Note:  Mother has decided to formula feed only.  Lactation suppression discussed.  Mother is wearing a supportive bra at this time.  She will be obtaining her formula through the [facility] department.   Maternal Data    Feeding Mother's Current Feeding Choice: Formula Nipple Type: Slow - flow  LATCH Score                    Lactation Tools Discussed/Used    Interventions    Discharge Discharge Education: Engorgement and breast care  Consult Status Consult Status: Complete Date: 02/18/21 Follow-up type: Call as needed    Lanice Schwab [place] Guiles 02/18/2021, 12:38 PM

## 2021-02-18 NOTE — Progress Notes (Signed)
Patient is eating, ambulating, voiding.  Pain control is good.  Appropriate lochia, no complaints.  Vitals:   02/17/21 2310 02/18/21 0015 02/18/21 0445 02/18/21 0817  BP: 111/66 (!) 102/59 104/63 99/63  Pulse: 88 76 71 66  Resp: 18 18 18 18   Temp: 98.6 F (37 C) 98.4 F (36.9 C) 98.1 F (36.7 C) 98.4 F (36.9 C)  TempSrc: Oral Oral Oral Oral  SpO2: 100% 99% 100% 99%  Weight:      Height:        Fundus firm Abd: nontender Ext: no calf tenderness  Lab Results  Component Value Date   WBC 16.2 (H) 02/18/2021   HGB 10.2 (L) 02/18/2021   HCT 29.8 (L) 02/18/2021   MCV 86.1 02/18/2021   PLT 274 02/18/2021    --/--/A POS (11/04 6770)  A/P Post partum day 1 Circ desired, consent obtained, plan for tomorrow. Patient doing well.  Routine care.    Allyn Kenner

## 2021-02-18 NOTE — Anesthesia Postprocedure Evaluation (Signed)
Anesthesia Post Note  Patient: Carol Middleton  Procedure(s) Performed: AN AD HOC LABOR EPIDURAL     Patient location during evaluation: Mother Baby Anesthesia Type: Epidural Level of consciousness: awake and alert Pain management: pain level controlled Vital Signs Assessment: post-procedure vital signs reviewed and stable Respiratory status: spontaneous breathing, nonlabored ventilation and respiratory function stable Cardiovascular status: stable Postop Assessment: no headache, no backache and epidural receding Anesthetic complications: no   No notable events documented.  Last Vitals:  Vitals:   02/18/21 0015 02/18/21 0445  BP: (!) 102/59 104/63  Pulse: 76 71  Resp: 18 18  Temp: 36.9 C 36.7 C  SpO2: 99% 100%    Last Pain:  Vitals:   02/18/21 0445  TempSrc: Oral  PainSc: 0-No pain   Pain Goal: Patients Stated Pain Goal: 10 (02/17/21 1705)                 Rayvon Char

## 2021-02-19 NOTE — Discharge Summary (Signed)
Postpartum Discharge Summary   Patient Name: Carol Middleton DOB: 09/01/1995 MRN: [id number]  Date of admission: 02/17/2021 Delivery date:02/17/2021  Delivering provider: Bobbye Charleston  Date of discharge: 02/19/2021  Admitting diagnosis: Pregnancy affected by fetal growth restriction [O36.5990] Intrauterine pregnancy: [redacted]w[redacted]d    Secondary diagnosis:  Active Problems:   Pregnancy affected by fetal growth restriction  Additional problems: bipolar disorder    Discharge diagnosis: Term Pregnancy Delivered        IUGR                                      Post partum procedures: none Augmentation: AROM and Pitocin Complications: None  Hospital course: Induction, vaginal delivery 25y.o. yo [id number] at 325w0das admitted in n 02/17/2021. Patient had an uncomplicated labor course as follows:  Membrane Rupture Time/Date: 5:00 PM ,02/17/2021   Delivery Method:Vaginal, Spontaneous  Episiotomy: None  Lacerations:  None  Patient had an uncomplicated postpartum course.  She is ambulating, tolerating a regular diet, passing flatus, and urinating well. Patient is discharged home in stable condition on 02/19/21.  Newborn Data: Birth date:02/17/2021  Birth time:9:18 PM  Gender:Female  Living status:Living  Apgars:8 ,9  Weight:2590 g   Magnesium Sulfate received: No BMZ received: No Rhophylac:N/A MMR:N/A T-DaP:   Flu: N/A Transfusion:No  Physical exam  Vitals:   02/18/21 1213 02/18/21 1300 02/18/21 2100 02/19/21 0558  BP: 104/61 113/73 121/85 103/66  Pulse: 72 65 98 72  Resp: '18 16 16 16  ' Temp: 98.7 F (37.1 C) 98.2 F (36.8 C) 98 F (36.7 C) 98.1 F (36.7 C)  TempSrc: Oral Oral Oral Oral  SpO2: 98%  100% 100%  Weight:      Height:       General: alert, cooperative, and no distress Lochia: appropriate Uterine Fundus: firm Incision: N/A DVT Evaluation: No evidence of DVT seen on physical exam. Labs: Lab Results  Component Value Date   WBC 16.2 (H) 02/18/2021   HGB  10.2 (L) 02/18/2021   HCT 29.8 (L) 02/18/2021   MCV 86.1 02/18/2021   PLT 274 02/18/2021   No flowsheet data found. Edinburgh Score: Edinburgh Postnatal Depression Scale Screening Tool 02/18/2021  I have been able to laugh and see the funny side of things. 0  I have looked forward with enjoyment to things. 0  I have blamed myself unnecessarily when things went wrong. 0  I have been anxious or worried for no good reason. 0  I have felt scared or panicky for no good reason. 0  Things have been getting on top of me. 1  I have been so unhappy that I have had difficulty sleeping. 0  I have felt sad or miserable. 0  I have been so unhappy that I have been crying. 0  The thought of harming myself has occurred to me. 0  Edinburgh Postnatal Depression Scale Total 1      After visit meds:  Allergies as of 02/19/2021       Reactions   Bee Venom    Codeine Hives, Swelling   Pt states throat became swollen   Keflex [cephalexin]         Medication List     STOP taking these medications    cephALEXin 500 MG capsule Commonly known as: Keflex       TAKE these medications    PRENATAL VITAMINS PO Take by  mouth.         Discharge home in stable condition Infant Feeding:  see peds note Infant Disposition:home with mother Discharge instruction: per After Visit Summary and Postpartum booklet. Activity: Advance as tolerated. Pelvic rest for 6 weeks.  Diet: routine diet Anticipated Birth Control: Plans Interval BTL Postpartum Appointment:4 weeks Additional Postpartum F/U: Postpartum Depression checkup Future Appointments:No future appointments. Follow up Visit:  Follow-up Information     Bobbye Charleston, MD Follow up in 4 week(s).   Specialty: Obstetrics and Gynecology Contact information: [address] [place] [address] [phone]                     02/19/2021 Allyn Kenner, DO

## 2021-02-20 ENCOUNTER — Telehealth: Payer: Self-pay | Admitting: Pediatrics

## 2021-02-20 NOTE — Telephone Encounter (Signed)
Opened in error

## 2021-03-03 ENCOUNTER — Telehealth (HOSPITAL_COMMUNITY): Payer: Self-pay | Admitting: *Deleted

## 2021-03-03 NOTE — Telephone Encounter (Signed)
Unable to contact, phone with busy signal.  Odis Hollingshead, RN 03-03-2021 at 10:46am

## 2021-04-06 ENCOUNTER — Other Ambulatory Visit: Payer: Self-pay | Admitting: Obstetrics and Gynecology

## 2021-04-06 DIAGNOSIS — Z01818 Encounter for other preprocedural examination: Secondary | ICD-10-CM

## 2021-04-13 ENCOUNTER — Other Ambulatory Visit: Payer: Self-pay | Admitting: Obstetrics and Gynecology

## 2021-04-21 ENCOUNTER — Encounter (HOSPITAL_BASED_OUTPATIENT_CLINIC_OR_DEPARTMENT_OTHER): Payer: Self-pay | Admitting: Obstetrics and Gynecology

## 2021-04-21 ENCOUNTER — Other Ambulatory Visit: Payer: Self-pay

## 2021-04-21 NOTE — Progress Notes (Signed)
Spoke w/ via phone for pre-op interview---pt Lab needs dos----CBC, urine pregnancy               Lab results------05/17/20 MRI brain in [place] test -----Pt stated that she has a slight cough. She will get COVID test on 04/24/2021. Arrive at -------1130 on 04/26/21 NPO after MN NO Solid Food.  Clear liquids from MN until---1030 Med rec completed Medications to take morning of surgery -----none Diabetic medication -----n/a Patient instructed no nail polish to be worn day of surgery Patient instructed to bring photo id and insurance card day of surgery Patient aware to have Driver (ride ) / caregiver    for 24 hours after surgery - Mauricio Po - boyfriend Elberta Fortis will have to be called to pickup patient. He will have the kids with him and will not be able to come in.) Patient Special Instructions -----none Pre-Op special Istructions -----none Patient verbalized understanding of instructions that were given at this phone interview. Patient denies shortness of breath, chest pain, fever, cough at this phone interview.   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  LOV 05/31/2020 Dr. Tommi Rumps in [place] MRI brain 05/17/2020 in [place] Patient has a history of seizures. Her first and second seizures were in 2018. Her last seizure was in 2021 per pt. She states that she was on seizure meds for one year. When she didn't have a seizure for over a year, she was taken off the meds and has not had a seizure since per pt.

## 2021-04-25 NOTE — Progress Notes (Signed)
Patient did not get Covid test on 04/24/21. She will come at 0930 on 04/26/21 to have a rapid test before surgery. She is aware that she needs to call the front desk and let them know she is here and wait in her car until results are called to her. Front desk Lanny Hurst) was notified.

## 2021-04-25 NOTE — H&P (Signed)
26 y.o. [id number] desires permanent sterilization.  Past Medical History:  Diagnosis Date   Anxiety    Bipolar 2 disorder ([place])    Borderline personality disorder ([place])    Brain lesion    Cystic lesion, followed by [facility], right frontal lobe, LOV 05/31/20   Concussion 02/04/2019   from hitting head on car door   Depression    Family history of adverse reaction to anesthesia    Patient states that her oldest daughter had a seizure when she was given conscious sedation.   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 Her last seizure was in 2021. She stated that she was on seizure meds for one year and hasn't had a seizure since 2021. Pt stated neurolgist took her off the seizure meds after one year.   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 She stated that she has always gotten frequent kidney and urinary tract infections.   Syncope and collapse 09/30/2020   Pt states that she had just been released from the hospital for having a kidney infection. She states that she passed out b/c she was weak with hunger. She denied any seizure activity.   Trichotillomania    Past Surgical History:  Procedure Laterality Date   WISDOM TOOTH EXTRACTION  12/30/2017    Social History   Socioeconomic History   Marital status: Single    Spouse name: Not on file   Number of children: Not on file   Years of education: Not on file   Highest education level: Not on file  Occupational History   Not on file  Tobacco Use   Smoking status: Every Day    Packs/day: 0.50    Years: 5.00    Pack years: 2.50    Types: Cigarettes   Smokeless tobacco: Never  Vaping Use   Vaping Use:  Never used  Substance and Sexual Activity   Alcohol use: Not Currently    Comment: very rarely   Drug use: Not Currently    Comment: marijuana- last had as a teenager   Sexual activity: Yes    Birth control/protection: None  Other Topics Concern   Not on file  Social History Narrative   Not on file   Social Determinants of Health   Financial Resource Strain: Not on file  Food Insecurity: Not on file  Transportation Needs: Not on file  Physical Activity: Not on file  Stress: Not on file  Social Connections: Not on file  Intimate Partner Violence: Not on file    No current facility-administered medications on file prior to encounter.   No current outpatient medications on file prior to encounter.    Allergies  Allergen Reactions   Bee Venom    Codeine Hives and Swelling    Pt states throat became swollen    There were no vitals filed for this visit.  Lungs: clear to ascultation Cor:  RRR Abdomen:  soft, nontender, nondistended. Ex:  no cords, erythema Pelvic:   Vulva: no masses, no atrophy, no lesions Bladder/Urethra: normal meatus, no urethral discharge, no urethral mass, bladder non distended Vagina no tenderness, no erythema,  no abnormal vaginal discharge, no vesicle(s) or ulcers, no cystocele, no rectocele (healed vagina and perineum) Cervix: grossly normal, no discharge, no cervical motion tenderness Uterus: normal size (7-8), normal shape, non-tender, no uterine prolapse, anteverted Adnexa/Parametria: no parametrial tenderness, no parametrial mass, no adnexal tenderness, no ovarian mass   A:  For filschie clip BTL via laparoscopy.   P: P: All risks, benefits and alternatives d/w patient and she desires to proceed.   Daria Pastures

## 2021-04-26 ENCOUNTER — Ambulatory Visit (HOSPITAL_BASED_OUTPATIENT_CLINIC_OR_DEPARTMENT_OTHER)
Admission: RE | Admit: 2021-04-26 | Discharge: 2021-04-26 | Disposition: A | Discharge status: Home / Self Care | Payer: Medicaid Other | Attending: Obstetrics and Gynecology | Admitting: Obstetrics and Gynecology

## 2021-04-26 ENCOUNTER — Ambulatory Visit (HOSPITAL_BASED_OUTPATIENT_CLINIC_OR_DEPARTMENT_OTHER): Payer: Medicaid Other | Admitting: Anesthesiology

## 2021-04-26 ENCOUNTER — Encounter (HOSPITAL_BASED_OUTPATIENT_CLINIC_OR_DEPARTMENT_OTHER)
Admission: RE | Disposition: A | Discharge status: Home / Self Care | Payer: Self-pay | Source: Home / Self Care | Attending: Obstetrics and Gynecology

## 2021-04-26 ENCOUNTER — Encounter (HOSPITAL_BASED_OUTPATIENT_CLINIC_OR_DEPARTMENT_OTHER): Payer: Self-pay | Admitting: Obstetrics and Gynecology

## 2021-04-26 DIAGNOSIS — F419 Anxiety disorder, unspecified: Secondary | ICD-10-CM | POA: Diagnosis not present

## 2021-04-26 DIAGNOSIS — F3181 Bipolar II disorder: Secondary | ICD-10-CM | POA: Diagnosis not present

## 2021-04-26 DIAGNOSIS — Z20822 Contact with and (suspected) exposure to covid-19: Secondary | ICD-10-CM | POA: Diagnosis not present

## 2021-04-26 DIAGNOSIS — Z302 Encounter for sterilization: Secondary | ICD-10-CM | POA: Insufficient documentation

## 2021-04-26 DIAGNOSIS — F17210 Nicotine dependence, cigarettes, uncomplicated: Secondary | ICD-10-CM | POA: Insufficient documentation

## 2021-04-26 DIAGNOSIS — R519 Headache, unspecified: Secondary | ICD-10-CM | POA: Insufficient documentation

## 2021-04-26 DIAGNOSIS — R569 Unspecified convulsions: Secondary | ICD-10-CM | POA: Insufficient documentation

## 2021-04-26 DIAGNOSIS — Z01818 Encounter for other preprocedural examination: Secondary | ICD-10-CM

## 2021-04-26 HISTORY — DX: Family history of other specified conditions: Z84.89

## 2021-04-26 HISTORY — DX: Medullary cystic kidney: Q61.5

## 2021-04-26 HISTORY — PX: LAPAROSCOPIC TUBAL LIGATION: SHX1937

## 2021-04-26 HISTORY — DX: Migraine, unspecified, not intractable, without status migrainosus: G43.909

## 2021-04-26 LAB — POCT PREGNANCY, URINE: Preg Test, Ur: NEGATIVE

## 2021-04-26 LAB — RESP PANEL BY RT-PCR (FLU A&B, COVID) ARPGX2
Influenza A by PCR: NEGATIVE
Influenza B by PCR: NEGATIVE
SARS Coronavirus 2 by RT PCR: NEGATIVE

## 2021-04-26 LAB — CBC
HCT: 41.1 % (ref 36.0–46.0)
Hemoglobin: 13.6 g/dL (ref 12.0–15.0)
MCH: 28.9 pg (ref 26.0–34.0)
MCHC: 33.1 g/dL (ref 30.0–36.0)
MCV: 87.4 fL (ref 80.0–100.0)
Platelets: 275 10*3/uL (ref 150–400)
RBC: 4.7 MIL/uL (ref 3.87–5.11)
RDW: 13.7 % (ref 11.5–15.5)
WBC: 9.5 10*3/uL (ref 4.0–10.5)
nRBC: 0 % (ref 0.0–0.2)

## 2021-04-26 SURGERY — LIGATION, FALLOPIAN TUBE, LAPAROSCOPIC
Anesthesia: General | Site: Abdomen | Laterality: Bilateral

## 2021-04-26 MED ORDER — LACTATED RINGERS IV SOLN: INTRAVENOUS | Status: DC

## 2021-04-26 MED ORDER — LIDOCAINE 2% (20 MG/ML) 5 ML SYRINGE
INTRAMUSCULAR | Status: DC | PRN
  Administered 2021-04-26: 100 mg via INTRAVENOUS

## 2021-04-26 MED ORDER — FENTANYL CITRATE (PF) 100 MCG/2ML IJ SOLN
INTRAMUSCULAR | Status: AC
  Filled 2021-04-26: qty 2

## 2021-04-26 MED ORDER — SUGAMMADEX SODIUM 200 MG/2ML IV SOLN
INTRAVENOUS | Status: DC | PRN
  Administered 2021-04-26: 200 mg via INTRAVENOUS

## 2021-04-26 MED ORDER — ROPIVACAINE HCL 5 MG/ML IJ SOLN
INTRAMUSCULAR | Status: DC | PRN
  Administered 2021-04-26: .0001 mL

## 2021-04-26 MED ORDER — MIDAZOLAM HCL 5 MG/5ML IJ SOLN
INTRAMUSCULAR | Status: DC | PRN
  Administered 2021-04-26: 2 mg via INTRAVENOUS

## 2021-04-26 MED ORDER — ACETAMINOPHEN 10 MG/ML IV SOLN
1000.0000 mg | Freq: Once | INTRAVENOUS | Status: AC
  Administered 2021-04-26: 1000 mg via INTRAVENOUS

## 2021-04-26 MED ORDER — ONDANSETRON HCL 4 MG/2ML IJ SOLN
INTRAMUSCULAR | Status: AC
  Filled 2021-04-26: qty 2

## 2021-04-26 MED ORDER — ACETAMINOPHEN 10 MG/ML IV SOLN
INTRAVENOUS | Status: AC
  Filled 2021-04-26: qty 100

## 2021-04-26 MED ORDER — KETOROLAC TROMETHAMINE 30 MG/ML IJ SOLN
INTRAMUSCULAR | Status: AC
  Filled 2021-04-26: qty 1

## 2021-04-26 MED ORDER — FENTANYL CITRATE (PF) 100 MCG/2ML IJ SOLN
INTRAMUSCULAR | Status: DC | PRN
  Administered 2021-04-26: 100 ug via INTRAVENOUS

## 2021-04-26 MED ORDER — POVIDONE-IODINE 10 % EX SWAB: 2.0000 "application " | Freq: Once | CUTANEOUS | Status: DC

## 2021-04-26 MED ORDER — DEXAMETHASONE SODIUM PHOSPHATE 10 MG/ML IJ SOLN
INTRAMUSCULAR | Status: AC
  Filled 2021-04-26: qty 1

## 2021-04-26 MED ORDER — DEXMEDETOMIDINE (PRECEDEX) IN NS 20 MCG/5ML (4 MCG/ML) IV SYRINGE
PREFILLED_SYRINGE | INTRAVENOUS | Status: DC | PRN
  Administered 2021-04-26: 8 ug via INTRAVENOUS
  Administered 2021-04-26: 12 ug via INTRAVENOUS

## 2021-04-26 MED ORDER — FENTANYL CITRATE (PF) 100 MCG/2ML IJ SOLN
25.0000 ug | INTRAMUSCULAR | Status: DC | PRN
  Administered 2021-04-26 (×3): 50 ug via INTRAVENOUS

## 2021-04-26 MED ORDER — MIDAZOLAM HCL 2 MG/2ML IJ SOLN
INTRAMUSCULAR | Status: AC
  Filled 2021-04-26: qty 2

## 2021-04-26 MED ORDER — OXYCODONE HCL 5 MG PO TABS
ORAL_TABLET | ORAL | Status: AC
  Filled 2021-04-26: qty 1

## 2021-04-26 MED ORDER — OXYCODONE HCL 5 MG PO TABS
5.0000 mg | ORAL_TABLET | Freq: Once | ORAL | Status: AC
  Administered 2021-04-26: 5 mg via ORAL

## 2021-04-26 MED ORDER — SCOPOLAMINE 1 MG/3DAYS TD PT72
MEDICATED_PATCH | TRANSDERMAL | Status: AC
  Filled 2021-04-26: qty 1

## 2021-04-26 MED ORDER — ACETAMINOPHEN 500 MG PO TABS
1000.0000 mg | ORAL_TABLET | Freq: Once | ORAL | Status: DC
Start: 2021-04-26 — End: 2021-04-26

## 2021-04-26 MED ORDER — LIDOCAINE 2% (20 MG/ML) 5 ML SYRINGE
INTRAMUSCULAR | Status: AC
  Filled 2021-04-26: qty 5

## 2021-04-26 MED ORDER — ROCURONIUM BROMIDE 10 MG/ML (PF) SYRINGE
PREFILLED_SYRINGE | INTRAVENOUS | Status: DC | PRN
  Administered 2021-04-26: 60 mg via INTRAVENOUS

## 2021-04-26 MED ORDER — ROCURONIUM BROMIDE 10 MG/ML (PF) SYRINGE
PREFILLED_SYRINGE | INTRAVENOUS | Status: AC
  Filled 2021-04-26: qty 10

## 2021-04-26 MED ORDER — ACETAMINOPHEN 500 MG PO TABS
ORAL_TABLET | ORAL | Status: AC
  Filled 2021-04-26: qty 2

## 2021-04-26 MED ORDER — ONDANSETRON HCL 4 MG/2ML IJ SOLN
INTRAMUSCULAR | Status: DC | PRN
  Administered 2021-04-26: 4 mg via INTRAVENOUS

## 2021-04-26 MED ORDER — PROMETHAZINE HCL 25 MG/ML IJ SOLN: 6.2500 mg | INTRAMUSCULAR | Status: DC | PRN

## 2021-04-26 MED ORDER — KETOROLAC TROMETHAMINE 30 MG/ML IJ SOLN
INTRAMUSCULAR | Status: DC | PRN
  Administered 2021-04-26: 30 mg via INTRAVENOUS

## 2021-04-26 MED ORDER — SOD CITRATE-CITRIC ACID 500-334 MG/5ML PO SOLN: 30.0000 mL | ORAL | Status: DC

## 2021-04-26 MED ORDER — AMISULPRIDE (ANTIEMETIC) 5 MG/2ML IV SOLN: 10.0000 mg | Freq: Once | INTRAVENOUS | Status: DC | PRN

## 2021-04-26 MED ORDER — SCOPOLAMINE 1 MG/3DAYS TD PT72
1.0000 | MEDICATED_PATCH | TRANSDERMAL | Status: DC
  Administered 2021-04-26: 1.5 mg via TRANSDERMAL

## 2021-04-26 MED ORDER — DEXAMETHASONE SODIUM PHOSPHATE 4 MG/ML IJ SOLN
INTRAMUSCULAR | Status: DC | PRN
  Administered 2021-04-26: 10 mg via INTRAVENOUS

## 2021-04-26 MED ORDER — ESMOLOL HCL 100 MG/10ML IV SOLN
INTRAVENOUS | Status: DC | PRN
  Administered 2021-04-26: 10 mg via INTRAVENOUS

## 2021-04-26 MED ORDER — PROPOFOL 10 MG/ML IV BOLUS
INTRAVENOUS | Status: DC | PRN
  Administered 2021-04-26: 200 mg via INTRAVENOUS

## 2021-04-26 SURGICAL SUPPLY — 23 items
ADH SKN CLS APL DERMABOND .7 (GAUZE/BANDAGES/DRESSINGS) ×1
CATH ROBINSON RED A/P 16FR (CATHETERS) ×2 IMPLANT
CLIP FILSHIE TUBAL LIGA STRL (Clip) ×2 IMPLANT
DERMABOND ADVANCED (GAUZE/BANDAGES/DRESSINGS) ×1
DERMABOND ADVANCED .7 DNX12 (GAUZE/BANDAGES/DRESSINGS) ×1 IMPLANT
DRSG OPSITE POSTOP 3X4 (GAUZE/BANDAGES/DRESSINGS) ×2 IMPLANT
DURAPREP 26ML APPLICATOR (WOUND CARE) ×2 IMPLANT
GAUZE 4X4 16PLY ~~LOC~~+RFID DBL (SPONGE) ×2 IMPLANT
GLOVE SURG ENC MOIS LTX SZ7 (GLOVE) ×4 IMPLANT
GOWN STRL REUS W/TWL LRG LVL3 (GOWN DISPOSABLE) ×6 IMPLANT
KIT TURNOVER CYSTO (KITS) ×2 IMPLANT
NEEDLE INSUFFLATION 120MM (ENDOMECHANICALS) ×2 IMPLANT
PACK LAPAROSCOPY BASIN (CUSTOM PROCEDURE TRAY) ×2 IMPLANT
PACK TRENDGUARD 450 HYBRID PRO (MISCELLANEOUS) ×1 IMPLANT
PROTECTOR NERVE ULNAR (MISCELLANEOUS) ×4 IMPLANT
SET TUBE SMOKE EVAC HIGH FLOW (TUBING) ×2 IMPLANT
SUT VIC AB 2-0 UR6 27 (SUTURE) ×2 IMPLANT
SUT VICRYL RAPIDE 3 0 (SUTURE) ×2 IMPLANT
SYR 50ML LL SCALE MARK (SYRINGE) ×2 IMPLANT
TRENDGUARD 450 HYBRID PRO PACK (MISCELLANEOUS) ×2
TROCAR OPTI TIP 5M 100M (ENDOMECHANICALS) IMPLANT
TROCAR XCEL DIL TIP R 11M (ENDOMECHANICALS) ×2 IMPLANT
WARMER LAPAROSCOPE (MISCELLANEOUS) ×2 IMPLANT

## 2021-04-26 NOTE — Anesthesia Postprocedure Evaluation (Signed)
Anesthesia Post Note  Patient: Carol Middleton  Procedure(s) Performed: LAPAROSCOPIC TUBAL LIGATION (Bilateral: Abdomen)     Patient location during evaluation: PACU Anesthesia Type: General Level of consciousness: awake Pain management: pain level controlled Vital Signs Assessment: post-procedure vital signs reviewed and stable Respiratory status: spontaneous breathing and respiratory function stable Cardiovascular status: stable Postop Assessment: no apparent nausea or vomiting Anesthetic complications: no   No notable events documented.  Last Vitals:  Vitals:   04/26/21 1415 04/26/21 1430  BP: 122/81 121/89  Pulse: 87 71  Resp: 18 17  Temp:    SpO2: 99% 99%    Last Pain:  Vitals:   04/26/21 1415  TempSrc:   PainSc: [place]

## 2021-04-26 NOTE — Transfer of Care (Signed)
Immediate Anesthesia Transfer of Care Note  Patient: Carol Middleton  Procedure(s) Performed: LAPAROSCOPIC TUBAL LIGATION (Bilateral: Abdomen)  Patient Location: PACU  Anesthesia Type:General  Level of Consciousness: alert   Airway & Oxygen Therapy: Patient Spontanous Breathing and Patient connected to face mask oxygen  Post-op Assessment: Report given to RN and Post -op Vital signs reviewed and stable  Post vital signs: Reviewed and stable  Last Vitals:  Vitals Value Taken Time  BP 121/83 04/26/21 1353  Temp    Pulse 89 04/26/21 1356  Resp 15 04/26/21 1356  SpO2 100 % 04/26/21 1356  Vitals shown include unvalidated device data.  Last Pain:  Vitals:   04/26/21 1144  TempSrc: Oral  PainSc: 0-No pain      Patients Stated Pain Goal: 5 (63/84/53 [id number])  Complications: No notable events documented.

## 2021-04-26 NOTE — Anesthesia Preprocedure Evaluation (Signed)
Anesthesia Evaluation  Patient identified by MRN, date of birth, ID band Patient awake    Reviewed: Allergy & Precautions, NPO status , Patient's Chart, lab work & pertinent test results  Airway Mallampati: I  TM Distance: >3 FB Neck ROM: Full    Dental no notable dental hx.    Pulmonary neg pulmonary ROS, Current Smoker,    Pulmonary exam normal breath sounds clear to auscultation       Cardiovascular Exercise Tolerance: Good negative cardio ROS Normal cardiovascular exam Rhythm:Regular Rate:Normal     Neuro/Psych  Headaches, Seizures -,  PSYCHIATRIC DISORDERS Anxiety Depression Bipolar Disorder    GI/Hepatic negative GI ROS, Neg liver ROS,   Endo/Other  negative endocrine ROS  Renal/GU negative Renal ROS  negative genitourinary   Musculoskeletal negative musculoskeletal ROS (+)   Abdominal   Peds negative pediatric ROS (+)  Hematology negative hematology ROS (+)   Anesthesia Other Findings   Reproductive/Obstetrics negative OB ROS                             Anesthesia Physical Anesthesia Plan  ASA: 2  Anesthesia Plan: General   Post-op Pain Management:    Induction: Intravenous  PONV Risk Score and Plan: 2 and Treatment may vary due to age or medical condition, Midazolam, Ondansetron and Dexamethasone  Airway Management Planned: LMA  Additional Equipment: None  Intra-op Plan:   Post-operative Plan: Extubation in OR  Informed Consent: I have reviewed the patients History and Physical, chart, labs and discussed the procedure including the risks, benefits and alternatives for the proposed anesthesia with the patient or authorized representative who has indicated his/her understanding and acceptance.     Dental advisory given  Plan Discussed with: CRNA, Anesthesiologist and Surgeon  Anesthesia Plan Comments:         Anesthesia Quick Evaluation

## 2021-04-26 NOTE — Anesthesia Procedure Notes (Signed)
Procedure Name: Intubation Date/Time: 04/26/2021 1:07 PM Performed by: Eulas Post, Amandine Covino W, CRNA Pre-anesthesia Checklist: Patient identified, Emergency Drugs available, Suction available and Patient being monitored Patient Re-evaluated:Patient Re-evaluated prior to induction Oxygen Delivery Method: Circle system utilized Preoxygenation: Pre-oxygenation with 100% oxygen Induction Type: IV induction Ventilation: Mask ventilation without difficulty Laryngoscope Size: Miller and 2 Grade View: Grade I Tube type: Oral Tube size: 7.0 mm Number of attempts: 1 Airway Equipment and Method: Stylet and Oral airway Placement Confirmation: ETT inserted through vocal cords under direct vision, positive ETCO2 and breath sounds checked- equal and bilateral Secured at: 20 cm Tube secured with: Tape Dental Injury: Teeth and Oropharynx as per pre-operative assessment

## 2021-04-26 NOTE — Brief Op Note (Signed)
04/26/2021  1:32 PM  PATIENT:  Carol Middleton  26 y.o. female  PRE-OPERATIVE DIAGNOSIS:  Sterilization  POST-OPERATIVE DIAGNOSIS:  Sterilization  PROCEDURE:  Procedure(s): LAPAROSCOPIC TUBAL LIGATION (Bilateral)  SURGEON:  Surgeon(s) and Role:    * Bobbye Charleston, MD - Primary  ANESTHESIA:   general  EBL: <20 cc  LOCAL MEDICATIONS USED:  NONE  SPECIMEN:  No Specimen  DISPOSITION OF SPECIMEN:  N/A  COUNTS:  YES  TOURNIQUET:  * No tourniquets in log *  DICTATION: .Note written in EPIC  PLAN OF CARE: Discharge to home after PACU  PATIENT DISPOSITION:  PACU - hemodynamically stable.   Delay start of Pharmacological VTE agent (>24hrs) due to surgical blood loss or risk of bleeding: not applicable

## 2021-04-26 NOTE — Op Note (Signed)
04/26/2021  1:32 PM  PATIENT:  Carol Middleton  26 y.o. female  PRE-OPERATIVE DIAGNOSIS:  Sterilization  POST-OPERATIVE DIAGNOSIS:  Sterilization  PROCEDURE:  Procedure(s): LAPAROSCOPIC TUBAL LIGATION (Bilateral)  SURGEON:  Surgeon(s) and Role:    * Bobbye Charleston, MD - Primary  ANESTHESIA:   general  EBL: <20 cc  LOCAL MEDICATIONS USED:  NONE  SPECIMEN:  No Specimen  DISPOSITION OF SPECIMEN:  N/A  COUNTS:  YES  TOURNIQUET:  * No tourniquets in log *  DICTATION: .Note written in EPIC  PLAN OF CARE: Discharge to home after PACU  PATIENT DISPOSITION:  PACU - hemodynamically stable.   Delay start of Pharmacological VTE agent (>24hrs) due to surgical blood loss or risk of bleeding: not applicable  Meds: none  Complications:  none  Findings: normal size uterus and normal ovaries and tubes  Technique  After adequate general endotracheal anesthesia was achieved, the patient was prepped and draped in the usual sterile fashion.  A 2 cm incision was made just below the umbilicus and the abdominal wall tented up.  The Veress needle was inserted at a 45 degree angle to the pelvis and no bowel contents or blood were aspirated.  The abdomen was insufflated and the 12 mm trocar placed without complication.  The operative scope was introduced and the above findings noted.  The filchie clip instrument was introduced and the tube were followed to their fimbriated ends bilaterally.  A filchie clip was placed on the isthmic portion of each tube and confirmed visually to be around the entire circumference of each tube.  After a quick inspection of the pelvis and liver edge, all instruments were removed and the abdomen was desufflated.  The 12 mm faschial incision was closed with a figure of eight stitch of 2-vicryl and the skin was closed with dermabond.  All instruments were removed from the vagina and the patient returned to the PACU in stable condition.  Giovanny Dugal A

## 2021-04-26 NOTE — Progress Notes (Signed)
There has been no change in the patients history, status or exam since the history and physical.  Vitals:   04/25/21 1714 04/26/21 1144  BP:  127/81  Pulse:  (!) 101  Resp:  15  Temp:  97.6 F (36.4 C)  TempSrc:  Oral  SpO2:  99%  Weight: 59.9 kg 58.1 kg  Height:  5\' 4"  (1.626 m)    Results for orders placed or performed during the hospital encounter of 04/26/21 (from the past 72 hour(s))  Resp Panel by RT-PCR (Flu A&B, Covid) Nasopharyngeal Swab     Status: None   Collection Time: 04/26/21  9:33 AM   Specimen: Nasopharyngeal Swab; Nasopharyngeal(NP) swabs in vial transport medium  Result Value Ref Range   SARS Coronavirus 2 by RT PCR NEGATIVE NEGATIVE    Comment: (NOTE) SARS-CoV-2 target nucleic acids are NOT DETECTED.  The SARS-CoV-2 RNA is generally detectable in upper respiratory specimens during the acute phase of infection. The lowest concentration of SARS-CoV-2 viral copies this assay can detect is 138 copies/mL. A negative result does not preclude SARS-Cov-2 infection and should not be used as the sole basis for treatment or other patient management decisions. A negative result may occur with  improper specimen collection/handling, submission of specimen other than nasopharyngeal swab, presence of viral mutation(s) within the areas targeted by this assay, and inadequate number of viral copies(<138 copies/mL). A negative result must be combined with clinical observations, patient history, and epidemiological information. The expected result is Negative.  Fact Sheet for Patients:  EntrepreneurPulse.com.au  Fact Sheet for Healthcare Providers:  IncredibleEmployment.be  This test is no t yet approved or cleared by the [place] FDA and  has been authorized for detection and/or diagnosis of SARS-CoV-2 by FDA under an Emergency Use Authorization (EUA). This EUA will remain  in effect (meaning this test can be used) for the  duration of the COVID-19 declaration under Section 564(b)(1) of the Act, 21 U.S.C.section 360bbb-3(b)(1), unless the authorization is terminated  or revoked sooner.       Influenza A by PCR NEGATIVE NEGATIVE   Influenza B by PCR NEGATIVE NEGATIVE    Comment: (NOTE) The Xpert Xpress SARS-CoV-2/FLU/RSV plus assay is intended as an aid in the diagnosis of influenza from Nasopharyngeal swab specimens and should not be used as a sole basis for treatment. Nasal washings and aspirates are unacceptable for Xpert Xpress SARS-CoV-2/FLU/RSV testing.  Fact Sheet for Patients: EntrepreneurPulse.com.au  Fact Sheet for Healthcare Providers: IncredibleEmployment.be  This test is not yet approved or cleared by the [place] FDA and has been authorized for detection and/or diagnosis of SARS-CoV-2 by FDA under an Emergency Use Authorization (EUA). This EUA will remain in effect (meaning this test can be used) for the duration of the COVID-19 declaration under Section 564(b)(1) of the Act, 21 U.S.C. section 360bbb-3(b)(1), unless the authorization is terminated or revoked.  Performed at [facility], [place] [address], [place], [place] [address]   Pregnancy, urine POC     Status: None   Collection Time: 04/26/21 11:27 AM  Result Value Ref Range   Preg Test, Ur NEGATIVE NEGATIVE    Comment:        THE SENSITIVITY OF THIS METHODOLOGY IS >24 mIU/mL     Daria Pastures

## 2021-04-26 NOTE — Discharge Instructions (Addendum)
Post Anesthesia Home Care Instructions  Activity: Get plenty of rest for the remainder of the day. A responsible individual must stay with you for 24 hours following the procedure.  For the next 24 hours, DO NOT: -Drive a car -Paediatric nurse -Drink alcoholic beverages -Take any medication unless instructed by your physician -Make any legal decisions or sign important papers.  Meals: Start with liquid foods such as gelatin or soup. Progress to regular foods as tolerated. Avoid greasy, spicy, heavy foods. If nausea and/or vomiting occur, drink only clear liquids until the nausea and/or vomiting subsides. Call your physician if vomiting continues.  Special Instructions/Symptoms: Your throat may feel dry or sore from the anesthesia or the breathing tube placed in your throat during surgery. If this causes discomfort, gargle with warm salt water. The discomfort should disappear within 24 hours.  If you had a scopolamine patch placed behind your ear for the management of post- operative nausea and/or vomiting:  1. The medication in the patch is effective for 72 hours, after which it should be removed.  Wrap patch in a tissue and discard in the trash. Wash hands thoroughly with soap and water. 2. You may remove the patch earlier than 72 hours if you experience unpleasant side effects which may include dry mouth, dizziness or visual disturbances. 3. Avoid touching the patch. Wash your hands with soap and water after contact with the patch.     Remove patch behind left ear by Saturday, April 29, 2021.  DISCHARGE INSTRUCTIONS: Laparoscopy  The following instructions have been prepared to help you care for yourself upon your return home today.  Wound care:  Do not get the incision wet for the first 24 hours. The incision should be kept clean and dry.  The Band-Aids or dressings may be removed the day after surgery.  Should the incision become sore, red, and swollen after the first week,  check with your doctor.  Personal hygiene:  Shower the day after your procedure.  Activity and limitations:  Do NOT drive or operate any equipment today.  Do NOT lift anything more than 15 pounds for 2-3 weeks after surgery.  Do NOT rest in bed all day.  Walking is encouraged. Walk each day, starting slowly with 5-minute walks 3 or 4 times a day. Slowly increase the length of your walks.  Walk up and down stairs slowly.  Do NOT do strenuous activities, such as golfing, playing tennis, bowling, running, biking, weight lifting, gardening, mowing, or vacuuming for 2-4 weeks. Ask your doctor when it is okay to start.  Diet: Eat a light meal as desired this evening. You may resume your usual diet tomorrow.  Return to work: This is dependent on the type of work you do. For the most part you can return to a desk job within a week of surgery. If you are more active at work, please discuss this with your doctor.  What to expect after your surgery: You may have a slight burning sensation when you urinate on the first day. You may have a very small amount of blood in the urine. Expect to have a small amount of vaginal discharge/light bleeding for 1-2 weeks. It is not unusual to have abdominal soreness and bruising for up to 2 weeks. You may be tired and need more rest for about 1 week. You may experience shoulder pain for 24-72 hours. Lying flat in bed may relieve it.  Call your doctor for any of the following:  Develop a  fever of 100.4 or greater  Inability to urinate 6 hours after discharge from hospital  Severe pain not relieved by pain medications  Persistent of heavy bleeding at incision site  Redness or swelling around incision site after a week  Increasing nausea or vomiting  May take liquid Tylenol beginning at 7 PM for pain. May take liquid Ibuprofen beginning at 8 PM as needed for pain.

## 2021-04-27 ENCOUNTER — Encounter (HOSPITAL_BASED_OUTPATIENT_CLINIC_OR_DEPARTMENT_OTHER): Payer: Self-pay | Admitting: Obstetrics and Gynecology
